# Patient Record
Sex: Female | Born: 1965 | Race: Black or African American | Hispanic: No | Marital: Married | Smoking: Never smoker
Health system: Southern US, Community
[De-identification: ages and names within clinical notes are randomized; demographics above are authoritative.]

## PROBLEM LIST (undated history)

## (undated) DIAGNOSIS — I82409 Acute embolism and thrombosis of unspecified deep veins of unspecified lower extremity: Secondary | ICD-10-CM

## (undated) DIAGNOSIS — R7303 Prediabetes: Secondary | ICD-10-CM

## (undated) DIAGNOSIS — F32A Depression, unspecified: Secondary | ICD-10-CM

## (undated) DIAGNOSIS — M1990 Unspecified osteoarthritis, unspecified site: Secondary | ICD-10-CM

## (undated) DIAGNOSIS — K219 Gastro-esophageal reflux disease without esophagitis: Secondary | ICD-10-CM

## (undated) DIAGNOSIS — D649 Anemia, unspecified: Secondary | ICD-10-CM

## (undated) DIAGNOSIS — F419 Anxiety disorder, unspecified: Secondary | ICD-10-CM

## (undated) DIAGNOSIS — I10 Essential (primary) hypertension: Secondary | ICD-10-CM

## (undated) DIAGNOSIS — R0600 Dyspnea, unspecified: Secondary | ICD-10-CM

## (undated) DIAGNOSIS — F329 Major depressive disorder, single episode, unspecified: Secondary | ICD-10-CM

## (undated) HISTORY — PX: ABDOMINAL HYSTERECTOMY: SHX81

## (undated) HISTORY — PX: WISDOM TOOTH EXTRACTION: SHX21

## (undated) HISTORY — PX: KNEE ARTHROSCOPY: SUR90

---
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
## 1998-02-15 ENCOUNTER — Emergency Department (HOSPITAL_COMMUNITY): Admission: EM | Admit: 1998-02-15 | Discharge: 1998-02-15 | Payer: Self-pay | Admitting: Emergency Medicine

## 1998-03-18 ENCOUNTER — Emergency Department (HOSPITAL_COMMUNITY): Admission: EM | Admit: 1998-03-18 | Discharge: 1998-03-18 | Payer: Self-pay | Admitting: Emergency Medicine

## 1998-03-23 ENCOUNTER — Ambulatory Visit (HOSPITAL_COMMUNITY): Admission: RE | Admit: 1998-03-23 | Discharge: 1998-03-23 | Payer: Self-pay | Admitting: Endocrinology

## 1998-04-10 ENCOUNTER — Encounter: Payer: Self-pay | Admitting: Endocrinology

## 1998-04-10 ENCOUNTER — Ambulatory Visit (HOSPITAL_COMMUNITY): Admission: RE | Admit: 1998-04-10 | Discharge: 1998-04-10 | Payer: Self-pay | Admitting: Endocrinology

## 1998-05-15 ENCOUNTER — Ambulatory Visit (HOSPITAL_BASED_OUTPATIENT_CLINIC_OR_DEPARTMENT_OTHER): Admission: RE | Admit: 1998-05-15 | Discharge: 1998-05-15 | Payer: Self-pay | Admitting: *Deleted

## 1998-10-04 ENCOUNTER — Emergency Department (HOSPITAL_COMMUNITY): Admission: EM | Admit: 1998-10-04 | Discharge: 1998-10-04 | Payer: Self-pay | Admitting: Emergency Medicine

## 2000-06-10 ENCOUNTER — Emergency Department (HOSPITAL_COMMUNITY): Admission: EM | Admit: 2000-06-10 | Discharge: 2000-06-11 | Payer: Self-pay | Admitting: Emergency Medicine

## 2000-06-11 ENCOUNTER — Ambulatory Visit (HOSPITAL_COMMUNITY): Admission: RE | Admit: 2000-06-11 | Discharge: 2000-06-11 | Payer: Self-pay | Admitting: Endocrinology

## 2001-08-20 ENCOUNTER — Encounter: Admission: RE | Admit: 2001-08-20 | Discharge: 2001-08-20 | Payer: Self-pay | Admitting: Endocrinology

## 2001-08-20 ENCOUNTER — Encounter: Payer: Self-pay | Admitting: Endocrinology

## 2010-06-18 ENCOUNTER — Emergency Department (HOSPITAL_BASED_OUTPATIENT_CLINIC_OR_DEPARTMENT_OTHER)
Admission: EM | Admit: 2010-06-18 | Discharge: 2010-06-18 | Payer: Self-pay | Source: Home / Self Care | Admitting: Emergency Medicine

## 2010-09-03 ENCOUNTER — Other Ambulatory Visit: Payer: Self-pay | Admitting: Orthopedic Surgery

## 2010-09-03 ENCOUNTER — Ambulatory Visit (HOSPITAL_COMMUNITY)
Admission: RE | Admit: 2010-09-03 | Discharge: 2010-09-03 | Disposition: A | Discharge status: Home / Self Care | Payer: Federal, State, Local not specified - PPO | Source: Ambulatory Visit | Attending: Orthopedic Surgery | Admitting: Orthopedic Surgery

## 2010-09-03 DIAGNOSIS — M79609 Pain in unspecified limb: Secondary | ICD-10-CM

## 2010-09-03 DIAGNOSIS — M7989 Other specified soft tissue disorders: Secondary | ICD-10-CM | POA: Insufficient documentation

## 2010-11-19 ENCOUNTER — Emergency Department (HOSPITAL_BASED_OUTPATIENT_CLINIC_OR_DEPARTMENT_OTHER)
Admission: EM | Admit: 2010-11-19 | Discharge: 2010-11-19 | Disposition: A | Discharge status: Home / Self Care | Payer: Federal, State, Local not specified - PPO | Attending: Emergency Medicine | Admitting: Emergency Medicine

## 2010-11-19 DIAGNOSIS — I10 Essential (primary) hypertension: Secondary | ICD-10-CM | POA: Insufficient documentation

## 2010-11-19 DIAGNOSIS — M549 Dorsalgia, unspecified: Secondary | ICD-10-CM | POA: Insufficient documentation

## 2010-11-19 DIAGNOSIS — K219 Gastro-esophageal reflux disease without esophagitis: Secondary | ICD-10-CM | POA: Insufficient documentation

## 2010-11-19 LAB — URINALYSIS, ROUTINE W REFLEX MICROSCOPIC
Bilirubin Urine: NEGATIVE
Glucose, UA: NEGATIVE mg/dL
Hgb urine dipstick: NEGATIVE
Ketones, ur: 15 mg/dL — AB
Leukocytes, UA: NEGATIVE
Nitrite: NEGATIVE
Protein, ur: NEGATIVE mg/dL
Specific Gravity, Urine: 1.025 (ref 1.005–1.030)
Urobilinogen, UA: 1 mg/dL (ref 0.0–1.0)
pH: 6.5 (ref 5.0–8.0)

## 2010-11-19 LAB — PREGNANCY, URINE: Preg Test, Ur: NEGATIVE

## 2011-02-22 ENCOUNTER — Other Ambulatory Visit: Payer: Self-pay | Admitting: Orthopedic Surgery

## 2011-02-22 DIAGNOSIS — R52 Pain, unspecified: Secondary | ICD-10-CM

## 2011-02-26 ENCOUNTER — Ambulatory Visit
Admission: RE | Admit: 2011-02-26 | Discharge: 2011-02-26 | Disposition: A | Discharge status: Home / Self Care | Payer: Federal, State, Local not specified - PPO | Source: Ambulatory Visit | Attending: Orthopedic Surgery | Admitting: Orthopedic Surgery

## 2011-02-26 DIAGNOSIS — R52 Pain, unspecified: Secondary | ICD-10-CM

## 2013-07-22 ENCOUNTER — Emergency Department (HOSPITAL_BASED_OUTPATIENT_CLINIC_OR_DEPARTMENT_OTHER)
Admission: EM | Admit: 2013-07-22 | Discharge: 2013-07-22 | Disposition: A | Discharge status: Home / Self Care | Payer: Federal, State, Local not specified - PPO | Attending: Emergency Medicine | Admitting: Emergency Medicine

## 2013-07-22 ENCOUNTER — Encounter (HOSPITAL_BASED_OUTPATIENT_CLINIC_OR_DEPARTMENT_OTHER): Payer: Self-pay | Admitting: Emergency Medicine

## 2013-07-22 ENCOUNTER — Emergency Department (HOSPITAL_BASED_OUTPATIENT_CLINIC_OR_DEPARTMENT_OTHER): Payer: Federal, State, Local not specified - PPO

## 2013-07-22 DIAGNOSIS — J209 Acute bronchitis, unspecified: Secondary | ICD-10-CM

## 2013-07-22 DIAGNOSIS — R5383 Other fatigue: Secondary | ICD-10-CM

## 2013-07-22 DIAGNOSIS — R5381 Other malaise: Secondary | ICD-10-CM | POA: Insufficient documentation

## 2013-07-22 DIAGNOSIS — R630 Anorexia: Secondary | ICD-10-CM | POA: Insufficient documentation

## 2013-07-22 MED ORDER — HYDROCOD POLST-CHLORPHEN POLST 10-8 MG/5ML PO LQCR
5.0000 mL | Freq: Once | ORAL | Status: AC
  Administered 2013-07-22: 5 mL via ORAL
  Filled 2013-07-22: qty 5

## 2013-07-22 MED ORDER — BENZONATATE 100 MG PO CAPS: 200.0000 mg | ORAL_CAPSULE | Freq: Three times a day (TID) | ORAL | Status: DC | PRN

## 2013-07-22 MED ORDER — HYDROCOD POLST-CHLORPHEN POLST 10-8 MG/5ML PO LQCR: 5.0000 mL | Freq: Two times a day (BID) | ORAL | Status: DC | PRN

## 2013-07-22 MED ORDER — ACETAMINOPHEN 325 MG PO TABS
650.0000 mg | ORAL_TABLET | Freq: Once | ORAL | Status: AC
  Administered 2013-07-22: 650 mg via ORAL
  Filled 2013-07-22: qty 2

## 2013-07-22 NOTE — ED Provider Notes (Signed)
CSN: [id number]     Arrival date & time 07/22/13  1732 History   First MD Initiated Contact with Patient 07/22/13 1955     Chief Complaint  Patient presents with  . URI     (Consider location/radiation/quality/duration/timing/severity/associated sxs/prior Treatment) HPI Comments: Beverly Salazar is a 48 y.o. Female presenting with a 4 day history of rather sudden onset of subjective fever and myalgias, nonproductive cough with bilateral anterior chest burning pain with cough only, clear nasal drainage and headache.  She also reports generalized fatigue and lack of appetite, but has been trying to maintain fluid intake.  She denies sore throat, ear or face pain, neck pain or stiffness and also denies shortness of breath,  nausea or abdominal pain.  She has taken an otc cough and cold remedy and motrin with transient relief of symptoms.     The history is provided by the patient.    History reviewed. No pertinent past medical history. Past Surgical History  Procedure Laterality Date  . Abdominal hysterectomy     No family history on file. History  Substance Use Topics  . Smoking status: Never Smoker   . Smokeless tobacco: Not on file  . Alcohol Use: No   OB History   Grav Para Term Preterm Abortions TAB SAB Ect Mult Living                 Review of Systems  Constitutional: Positive for fever and fatigue. Negative for chills.  HENT: Positive for rhinorrhea. Negative for congestion, ear pain, sinus pressure, sore throat, trouble swallowing and voice change.   Eyes: Negative for discharge.  Respiratory: Positive for cough. Negative for chest tightness, shortness of breath, wheezing and stridor.   Cardiovascular: Negative for chest pain.  Gastrointestinal: Negative for nausea, vomiting, abdominal pain and diarrhea.  Genitourinary: Negative.       Allergies  Review of patient's allergies indicates no known allergies.  Home Medications  No current outpatient  prescriptions on file. BP 134/90  Pulse 97  Temp(Src) 99.1 F (37.3 C) (Oral)  Resp 20  Ht 5\' 8"  (1.727 m)  Wt 250 lb (113.399 kg)  BMI 38.02 kg/m2  SpO2 98% Physical Exam  Nursing note and vitals reviewed. Constitutional: She appears well-developed and well-nourished.  HENT:  Head: Normocephalic and atraumatic.  Right Ear: External ear normal.  Left Ear: External ear normal.  Mouth/Throat: Oropharynx is clear and moist. No oropharyngeal exudate.  Eyes: Conjunctivae are normal.  Neck: Normal range of motion. Neck supple.  Cardiovascular: Normal rate, regular rhythm, normal heart sounds and intact distal pulses.   Pulmonary/Chest: Effort normal and breath sounds normal. She has no wheezes. She has no rales. She exhibits no tenderness.  Abdominal: Soft. Bowel sounds are normal. There is no tenderness.  Musculoskeletal: Normal range of motion.  Lymphadenopathy:    She has no cervical adenopathy.  Neurological: She is alert.  Skin: Skin is warm and dry.  Psychiatric: She has a normal mood and affect.    ED Course  Procedures (including critical care time) Labs Review Labs Reviewed - No data to display Imaging Review Dg Chest 2 View  07/22/2013   CLINICAL DATA:  Cough and congestion  EXAM: CHEST  2 VIEW  COMPARISON:  05/03/2012  FINDINGS: The heart size and mediastinal contours are within normal limits. Both lungs are clear. The visualized skeletal structures are unremarkable.  IMPRESSION: No active cardiopulmonary disease.   Electronically Signed   By: Eulah PontMark  Lukens M.D.  On: 07/22/2013 20:42    EKG Interpretation   None       MDM   Final diagnoses:  Bronchitis, acute    Patients labs and/or radiological studies were viewed and considered during the medical decision making and disposition process. Sx c/w acute bronchitis, but sx also flu like per history.  Discussed pros and cons of tamiflu,  Doubt helpfulness given pt's sx have been present for 4 days.  Pt defers  this medicine.  Will prescribe tessalon and tussionex for cough and chest pain.  Encouraged rest, increased fluid intake, tylenol or motrin for fever reduction, f/u with pcp or return here for any worsened sx.  Pt understands and agrees with plan.    Burgess Amor, PA-C 07/23/13 2103

## 2013-07-22 NOTE — ED Notes (Signed)
Patient transported to X-ray 

## 2013-07-22 NOTE — ED Notes (Signed)
Cough, headache, chills, fever, x 4 days.

## 2013-07-22 NOTE — Discharge Instructions (Signed)
Acute Bronchitis Bronchitis is inflammation of the airways that extend from the windpipe into the lungs (bronchi). The inflammation often causes mucus to develop. This leads to a cough, which is the most common symptom of bronchitis.  In acute bronchitis, the condition usually develops suddenly and goes away over time, usually in a couple weeks. Smoking, allergies, and asthma can make bronchitis worse. Repeated episodes of bronchitis may cause further lung problems.  CAUSES Acute bronchitis is most often caused by the same virus that causes a cold. The virus can spread from person to person (contagious).  SIGNS AND SYMPTOMS   Cough.   Fever.   Coughing up mucus.   Body aches.   Chest congestion.   Chills.   Shortness of breath.   Sore throat.  DIAGNOSIS  Acute bronchitis is usually diagnosed through a physical exam. Tests, such as chest X-rays, are sometimes done to rule out other conditions.  TREATMENT  Acute bronchitis usually goes away in a couple weeks. Often times, no medical treatment is necessary. Medicines are sometimes given for relief of fever or cough. Antibiotics are usually not needed but may be prescribed in certain situations. In some cases, an inhaler may be recommended to help reduce shortness of breath and control the cough. A cool mist vaporizer may also be used to help thin bronchial secretions and make it easier to clear the chest.  HOME CARE INSTRUCTIONS  Get plenty of rest.   Drink enough fluids to keep your urine clear or pale yellow (unless you have a medical condition that requires fluid restriction). Increasing fluids may help thin your secretions and will prevent dehydration.   Only take over-the-counter or prescription medicines as directed by your health care provider.   Avoid smoking and secondhand smoke. Exposure to cigarette smoke or irritating chemicals will make bronchitis worse. If you are a smoker, consider using nicotine gum or skin  patches to help control withdrawal symptoms. Quitting smoking will help your lungs heal faster.   Reduce the chances of another bout of acute bronchitis by washing your hands frequently, avoiding people with cold symptoms, and trying not to touch your hands to your mouth, nose, or eyes.   Follow up with your health care provider as directed.  SEEK MEDICAL CARE IF: Your symptoms do not improve after 1 week of treatment.  SEEK IMMEDIATE MEDICAL CARE IF:  You develop an increased fever or chills.   You have chest pain.   You have severe shortness of breath.  You have bloody sputum.   You develop dehydration.  You develop fainting.  You develop repeated vomiting.  You develop a severe headache. MAKE SURE YOU:   Understand these instructions.  Will watch your condition.  Will get help right away if you are not doing well or get worse. Document Released: 07/04/2004 Document Revised: 01/27/2013 Document Reviewed: 11/17/2012 [facility] Patient Information 2014 [employer], [place].   Rest and make sure you're drinking plenty of fluids.  You may use the tussionex syrup for cough relief, this will make you drowsy, do not drive within 4 hours of taking this medication.  Your x-rays normal tonight with no sign of lung infection such as pneumonia.  Get rechecked for any worsened symptoms.

## 2013-07-24 NOTE — ED Provider Notes (Signed)
Medical screening examination/treatment/procedure(s) were performed by non-physician practitioner and as supervising physician I was immediately available for consultation/collaboration.  EKG Interpretation   None        Glynn OctaveStephen Anzley Dibbern, MD 07/24/13 1040

## 2016-05-27 ENCOUNTER — Emergency Department (HOSPITAL_BASED_OUTPATIENT_CLINIC_OR_DEPARTMENT_OTHER)
Admission: EM | Admit: 2016-05-27 | Discharge: 2016-05-28 | Disposition: A | Discharge status: Home / Self Care | Payer: Federal, State, Local not specified - PPO | Attending: Emergency Medicine | Admitting: Emergency Medicine

## 2016-05-27 ENCOUNTER — Encounter (HOSPITAL_BASED_OUTPATIENT_CLINIC_OR_DEPARTMENT_OTHER): Payer: Self-pay | Admitting: *Deleted

## 2016-05-27 DIAGNOSIS — G8929 Other chronic pain: Secondary | ICD-10-CM | POA: Diagnosis not present

## 2016-05-27 DIAGNOSIS — M25562 Pain in left knee: Secondary | ICD-10-CM | POA: Insufficient documentation

## 2016-05-27 DIAGNOSIS — R300 Dysuria: Secondary | ICD-10-CM | POA: Diagnosis present

## 2016-05-27 DIAGNOSIS — I10 Essential (primary) hypertension: Secondary | ICD-10-CM | POA: Diagnosis not present

## 2016-05-27 DIAGNOSIS — Z79899 Other long term (current) drug therapy: Secondary | ICD-10-CM | POA: Insufficient documentation

## 2016-05-27 DIAGNOSIS — N3000 Acute cystitis without hematuria: Secondary | ICD-10-CM | POA: Diagnosis not present

## 2016-05-27 HISTORY — DX: Essential (primary) hypertension: I10

## 2016-05-27 LAB — URINALYSIS, ROUTINE W REFLEX MICROSCOPIC
Bilirubin Urine: NEGATIVE
Glucose, UA: NEGATIVE mg/dL
Hgb urine dipstick: NEGATIVE
KETONES UR: NEGATIVE mg/dL
NITRITE: NEGATIVE
PROTEIN: NEGATIVE mg/dL
Specific Gravity, Urine: 1.024 (ref 1.005–1.030)
pH: 6 (ref 5.0–8.0)

## 2016-05-27 LAB — URINALYSIS, MICROSCOPIC (REFLEX)

## 2016-05-27 MED ORDER — TRAMADOL HCL 50 MG PO TABS
50.0000 mg | ORAL_TABLET | Freq: Once | ORAL | Status: AC
  Administered 2016-05-28: 50 mg via ORAL
  Filled 2016-05-27: qty 1

## 2016-05-27 MED ORDER — ACETAMINOPHEN 500 MG PO TABS
1000.0000 mg | ORAL_TABLET | Freq: Once | ORAL | Status: AC
  Administered 2016-05-28: 1000 mg via ORAL
  Filled 2016-05-27: qty 2

## 2016-05-27 NOTE — ED Provider Notes (Signed)
[facility] Provider Note   CSN: [id number] Arrival date & time: 05/27/16  2126   By signing my name below, I, Vista Minkobert Ross, attest that this documentation has been prepared under the direction and in the presence of Tomasita CrumbleAdeleke Naida Escalante, MD. Electronically signed, Vista Minkobert Ross, ED Scribe. 05/27/16. 11:58 PM.   History   Chief Complaint Chief Complaint  Patient presents with  . Leg Pain    HPI HPI Comments: Beverly Salazar is a 50 y.o. female, with hx of htn, who presents to the Emergency Department complaining of pain and swelling to bilateral lower extremities that started two weeks ago but gradually worsened today. She states that she heard a pop today in her left knee and has exacerbating pain during ambulation and movement. She has taken 800mg  Motrin with no relief of pain. Pt states that she needs a left knee replacement. Dr. August Saucerean is her orthopedic doctor. Pt also complains of decreased urine output recently as well as decreased urgency. Pt has had UTI's in the past but states it has been a long time. She does not remember what symptoms she had with past UTI's. She has no Hx of kidney problems. No abdominal pain. No dysuria.  The history is provided by the patient. No language interpreter was used.   Past Medical History:  Diagnosis Date  . Hypertension     There are no active problems to display for this patient.   Past Surgical History:  Procedure Laterality Date  . ABDOMINAL HYSTERECTOMY      OB History    No data available      Home Medications    Prior to Admission medications   Medication Sig Start Date End Date Taking? Authorizing Provider  HYDROCHLOROTHIAZIDE PO Take by mouth.   Yes Historical Provider, MD  IBUPROFEN PO Take by mouth.   Yes Historical Provider, MD  benzonatate (TESSALON) 100 MG capsule Take 2 capsules (200 mg total) by mouth 3 (three) times daily as needed for cough. 07/22/13   Burgess AmorJulie Idol, PA-C  chlorpheniramine-HYDROcodone (TUSSIONEX  PENNKINETIC ER) 10-8 MG/5ML LQCR Take 5 mLs by mouth every 12 (twelve) hours as needed for cough. 07/22/13   Burgess AmorJulie Idol, PA-C    Family History No family history on file.  Social History Social History  Substance Use Topics  . Smoking status: Never Smoker  . Smokeless tobacco: Never Used  . Alcohol use No    Allergies   Patient has no known allergies.   Review of Systems Review of Systems A complete 10 system review of systems was obtained and all systems are negative except as noted in the HPI and PMH.    Physical Exam Updated Vital Signs BP 115/70 (BP Location: Right Arm)   Pulse 62   Temp 98.5 F (36.9 C) (Oral)   Resp 20   Ht 5\' 8"  (1.727 m)   Wt 250 lb (113.4 kg)   SpO2 99%   BMI 38.01 kg/m   Physical Exam  Constitutional: She is oriented to person, place, and time. She appears well-developed and well-nourished. No distress.  HENT:  Head: Normocephalic and atraumatic.  Nose: Nose normal.  Mouth/Throat: Oropharynx is clear and moist. No oropharyngeal exudate.  Eyes: Conjunctivae and EOM are normal. Pupils are equal, round, and reactive to light. No scleral icterus.  Neck: Normal range of motion. Neck supple. No JVD present. No tracheal deviation present. No thyromegaly present.  Cardiovascular: Normal rate, regular rhythm and normal heart sounds.  Exam reveals no gallop  and no friction rub.   No murmur heard. Pulmonary/Chest: Effort normal and breath sounds normal. No respiratory distress. She has no wheezes. She exhibits no tenderness.  Abdominal: Soft. Bowel sounds are normal. She exhibits no distension and no mass. There is no tenderness. There is no rebound and no guarding.  Musculoskeletal: She exhibits tenderness. She exhibits no edema or deformity.  Left knee. No swelling. No deformity. Pain with passive ROM. Good joint stability.   Lymphadenopathy:    She has no cervical adenopathy.  Neurological: She is alert and oriented to person, place, and time. No  cranial nerve deficit. She exhibits normal muscle tone.  Skin: Skin is warm and dry. No rash noted. No erythema. No pallor.  Nursing note and vitals reviewed.   ED Treatments / Results  DIAGNOSTIC STUDIES: Oxygen Saturation is 99% on RA, normal by my interpretation.  COORDINATION OF CARE: 11:53 PM-Discussed treatment plan with pt at bedside and pt agreed to plan.   Labs (all labs ordered are listed, but only abnormal results are displayed) Labs Reviewed  URINALYSIS, ROUTINE W REFLEX MICROSCOPIC - Abnormal; Notable for the following:       Result Value   APPearance CLOUDY (*)    Leukocytes, UA SMALL (*)    All other components within normal limits  URINALYSIS, MICROSCOPIC (REFLEX) - Abnormal; Notable for the following:    Bacteria, UA MANY (*)    Squamous Epithelial / LPF 6-30 (*)    All other components within normal limits    EKG  EKG Interpretation None       Radiology No results found.  Procedures Procedures (including critical care time)  Medications Ordered in ED Medications - No data to display   Initial Impression / Assessment and Plan / ED Course  I have reviewed the triage vital signs and the nursing notes.  Pertinent labs & imaging results that were available during my care of the patient were reviewed by me and considered in my medical decision making (see chart for details).  Clinical Course    Patient presents to the ED for worsening knee pain.  It is acute on chronic and she has an ortho doc she can follow up with.  She was given tramdol for breakthrough pain.     UA reveals bacteria.  Patient has had UTIs in the past and does not know if they typically present with dec frequency.  Will treat in this setting with keflex.  PCP and ortho fu advised.  She appears well and in NAD. Cr level is normal.  VS remain within her normal limits and she is safe for DC    Final Clinical Impressions(s) / ED Diagnoses   Final diagnoses:  None    New  Prescriptions New Prescriptions   No medications on file    I personally performed the services described in this documentation, which was scribed in my presence. The recorded information has been reviewed and is accurate.       Tomasita CrumbleAdeleke Myrtie Leuthold, MD 05/28/16 [phone]

## 2016-05-27 NOTE — ED Triage Notes (Signed)
Pain and swelling in both legs x 2 weeks. Decreased urine output.

## 2016-05-28 LAB — CBC WITH DIFFERENTIAL/PLATELET
BASOS ABS: 0 10*3/uL (ref 0.0–0.1)
BASOS PCT: 0 %
EOS ABS: 0.1 10*3/uL (ref 0.0–0.7)
EOS PCT: 1 %
HCT: 34 % — ABNORMAL LOW (ref 36.0–46.0)
HEMOGLOBIN: 11.2 g/dL — AB (ref 12.0–15.0)
Lymphocytes Relative: 41 %
Lymphs Abs: 2.9 10*3/uL (ref 0.7–4.0)
MCH: 29.3 pg (ref 26.0–34.0)
MCHC: 32.9 g/dL (ref 30.0–36.0)
MCV: 89 fL (ref 78.0–100.0)
Monocytes Absolute: 0.7 10*3/uL (ref 0.1–1.0)
Monocytes Relative: 9 %
NEUTROS PCT: 49 %
Neutro Abs: 3.5 10*3/uL (ref 1.7–7.7)
PLATELETS: 307 10*3/uL (ref 150–400)
RBC: 3.82 MIL/uL — AB (ref 3.87–5.11)
RDW: 13.7 % (ref 11.5–15.5)
WBC: 7.2 10*3/uL (ref 4.0–10.5)

## 2016-05-28 LAB — BASIC METABOLIC PANEL
ANION GAP: 7 (ref 5–15)
BUN: 15 mg/dL (ref 6–20)
CO2: 26 mmol/L (ref 22–32)
Calcium: 8.6 mg/dL — ABNORMAL LOW (ref 8.9–10.3)
Chloride: 103 mmol/L (ref 101–111)
Creatinine, Ser: 0.83 mg/dL (ref 0.44–1.00)
Glucose, Bld: 103 mg/dL — ABNORMAL HIGH (ref 65–99)
POTASSIUM: 3.1 mmol/L — AB (ref 3.5–5.1)
SODIUM: 136 mmol/L (ref 135–145)

## 2016-05-28 MED ORDER — CEPHALEXIN 500 MG PO CAPS: 500.0000 mg | ORAL_CAPSULE | Freq: Two times a day (BID) | ORAL | 0 refills | Status: DC

## 2016-05-28 MED ORDER — TRAMADOL HCL 50 MG PO TABS: 50.0000 mg | ORAL_TABLET | Freq: Two times a day (BID) | ORAL | 0 refills | Status: DC | PRN

## 2016-05-29 ENCOUNTER — Ambulatory Visit (INDEPENDENT_AMBULATORY_CARE_PROVIDER_SITE_OTHER): Admitting: Orthopedic Surgery

## 2016-05-29 ENCOUNTER — Encounter (INDEPENDENT_AMBULATORY_CARE_PROVIDER_SITE_OTHER): Payer: Self-pay | Admitting: Orthopedic Surgery

## 2016-05-29 ENCOUNTER — Ambulatory Visit (INDEPENDENT_AMBULATORY_CARE_PROVIDER_SITE_OTHER): Payer: Self-pay

## 2016-05-29 DIAGNOSIS — M1711 Unilateral primary osteoarthritis, right knee: Secondary | ICD-10-CM | POA: Diagnosis not present

## 2016-05-29 DIAGNOSIS — G8929 Other chronic pain: Secondary | ICD-10-CM

## 2016-05-29 DIAGNOSIS — M25561 Pain in right knee: Secondary | ICD-10-CM | POA: Diagnosis not present

## 2016-05-29 DIAGNOSIS — M2392 Unspecified internal derangement of left knee: Secondary | ICD-10-CM | POA: Diagnosis not present

## 2016-05-29 NOTE — Progress Notes (Signed)
Office Visit Note   Patient: Beverly Salazar           Date of Birth: [birth year]           MRN: [id number] Visit Date: 05/29/2016 Requested by: No referring provider defined for this encounter. PCP: Zoila ShutterWOODYEAR,WYNNE E, MD  Subjective: Chief Complaint  Patient presents with  . Left Knee - Pain    HPILori is a 50 year old female with left knee pain.  She injured the left knee 05/27/2016 while at work.  She was bending down to put something in the safe and then when she strained her knee she had a pop and has been really unable to weight-bear since that time.  This was a witnessed event by one of her coworkers. Been taking tramadol for pain.  No radiographs. She has no prior surgery to the left knee.  It is very difficult for her to straighten the left leg.              Review of Systems All systems reviewed are negative as they relate to the chief complaint within the history of present illness.  Patient denies  fevers or chills.    Assessment & Plan: Visit Diagnoses:  1. Primary osteoarthritis of right knee   2. Chronic pain of right knee   3. Locked knee, left     Plan: Impression is left locked knee with effusion and anteromedial joint line tenderness.  In all likelihood this represents a bucket-handle tear of the meniscus.  She's having difficulty weightbearing and has difficulty achieving full extension.  This does go along with an anterior flipped fragment.  No loose bodies seen that are calcified on the lateral radiograph. Plan is for MRI scan and out of work for 3 weeks.  Tramadol prescription provided.  Also wrote a prescription for Norco in case the pain is more severe.  I'll see her back after the MRI scan Follow-Up Instructions: No Follow-up on file.   Orders:  Orders Placed This Encounter  Procedures  . XR KNEE 3 VIEW LEFT  . MR Knee Left w/o contrast   No orders of the defined types were placed in this encounter.     Procedures: No procedures  performed   Clinical Data: No additional findings.  Objective: Vital Signs: There were no vitals taken for this visit.  Physical Exam   Constitutional: Patient appears well-developed HEENT:  Head: Normocephalic Eyes:EOM are normal Neck: Normal range of motion Cardiovascular: Normal rate Pulmonary/chest: Effort normal Neurologic: Patient is alert Skin: Skin is warm Psychiatric: Patient has normal mood and affect    Ortho ExamExamination of the left knee demonstrates an effusion much tenderness with attempts at straightening the leg.  She lacks about 10 of full extension.  She can flex it to 90 but then it becomes painful.  Collaterals and cruciates feel stable.  She does have medial and lateral joint line tenderness.  McMurray compression testing positive for medial and lateral compartment pathology pedal pulses palpable.  No other masses lymph adenopathy or skin changes noted in the left knee region  Specialty Comments:  No specialty comments available.  Imaging: Xr Knee 3 View Left  Result Date: 05/29/2016 3 views left knee were reviewed.  Mild joint space narrowing is noted on the medial compartment.  Lateral compartment is maintained.  Mild patellofemoral degenerative changes present.  No loose bodies present in the anterior joint.  Fabella present posteriorly    PMFS History: There are no active problems to  display for this patient.  Past Medical History:  Diagnosis Date  . Hypertension     No family history on file.  Past Surgical History:  Procedure Laterality Date  . ABDOMINAL HYSTERECTOMY     Social History   Occupational History  . Not on file.   Social History Main Topics  . Smoking status: Never Smoker  . Smokeless tobacco: Never Used  . Alcohol use No  . Drug use: No  . Sexual activity: Not on file

## 2016-05-30 ENCOUNTER — Ambulatory Visit (INDEPENDENT_AMBULATORY_CARE_PROVIDER_SITE_OTHER): Payer: Self-pay | Admitting: Orthopedic Surgery

## 2016-06-08 ENCOUNTER — Inpatient Hospital Stay: Admission: RE | Admit: 2016-06-08 | Payer: Federal, State, Local not specified - PPO | Source: Ambulatory Visit

## 2016-06-17 ENCOUNTER — Ambulatory Visit (INDEPENDENT_AMBULATORY_CARE_PROVIDER_SITE_OTHER): Payer: Federal, State, Local not specified - PPO | Admitting: Physician Assistant

## 2016-06-19 ENCOUNTER — Telehealth (INDEPENDENT_AMBULATORY_CARE_PROVIDER_SITE_OTHER): Payer: Self-pay | Admitting: Orthopedic Surgery

## 2016-06-19 NOTE — Telephone Encounter (Signed)
Patient called advised she has not got an approval through [facility] for [facility] to have [facility]  because [facility] is saying they have not received any medical information on her. Patient said  [facility] a form Ca20 for Dr August Saucerean to fill out and she will bring it in. The number to contact her is [phone]

## 2016-06-20 ENCOUNTER — Telehealth (INDEPENDENT_AMBULATORY_CARE_PROVIDER_SITE_OTHER): Payer: Self-pay | Admitting: Orthopedic Surgery

## 2016-06-20 NOTE — Telephone Encounter (Signed)
I talked to patient and she says that [facility] told her they could not accept her claim yet, since we listed OA of the knee, and pain of the knee.  The office note listed OA RIGHT knee and pain RIGHT knee, and then dx was LOCKED LEFT KNEE.  I wonder if they are looking at this all wrong?  The note details the injury and there is a dx of locked knee.  Can you reach out to them please?

## 2016-06-20 NOTE — Telephone Encounter (Signed)
IC NA LMVM.  See other msg

## 2016-06-20 NOTE — Telephone Encounter (Signed)
Pt called back regarding previous message and asked if you could please give her a call asap as it is important The number to contact her is [phone]

## 2016-06-20 NOTE — Telephone Encounter (Signed)
IC patient back as requested from today's message, NA, LMVM to call me.  Per referral in chart no auth yet.

## 2016-06-21 ENCOUNTER — Ambulatory Visit (INDEPENDENT_AMBULATORY_CARE_PROVIDER_SITE_OTHER): Payer: Federal, State, Local not specified - PPO | Admitting: Orthopedic Surgery

## 2016-06-25 ENCOUNTER — Telehealth (INDEPENDENT_AMBULATORY_CARE_PROVIDER_SITE_OTHER): Payer: Self-pay | Admitting: *Deleted

## 2016-06-25 NOTE — Telephone Encounter (Signed)
Pt called back to return call  °

## 2016-06-25 NOTE — Telephone Encounter (Signed)
Faxed dept of labor auth form with office note to [id number] with correct claim # [id number]. Put M23.92 (locked knee) as primary dx

## 2016-06-25 NOTE — Telephone Encounter (Signed)
Can you help with this? Patient is out of work and her knee is locked.  She needs an MRI.  Patient has said that she called WC and they have no request for MRI from us.  Per patient she has left several messages for Misty StanleyLisa and has heard no response from [employer].  Can you reach out to [employer] and to [facility] and see what we can do to get this approved ASAP?

## 2016-06-25 NOTE — Telephone Encounter (Signed)
Left vm for pt to call me back to discuss work comp claim.

## 2016-06-25 NOTE — Telephone Encounter (Signed)
Pt called left message on vm stating she is needing to speak to someone about her WC information. I called pt back and she states that is has been a month now and that she hasnt heard anything about her approval for MRI thru WC. She has tried numerous times to contact [employer] and left messages.. Pt decides to contact her WC case manager and they told her they never received the faxed for MRI. I informed pt that I would contact Misty StanleyLisa to see where we stand on this and that once I find out what status is she would hear from either myself or Misty StanleyLisa. I contacted Misty StanleyLisa and she states [facility] and that it is in review and that pt will receive notification by mail whether she has been approved or denied, but would not tell Misty StanleyLisa. Misty StanleyLisa is going to call pt and explain to pt.

## 2016-06-26 ENCOUNTER — Ambulatory Visit (HOSPITAL_COMMUNITY): Admission: RE | Admit: 2016-06-26 | Payer: Federal, State, Local not specified - PPO | Source: Ambulatory Visit

## 2016-06-28 ENCOUNTER — Ambulatory Visit (HOSPITAL_COMMUNITY): Admission: RE | Admit: 2016-06-28 | Payer: Federal, State, Local not specified - PPO | Source: Ambulatory Visit

## 2016-06-28 ENCOUNTER — Ambulatory Visit (HOSPITAL_COMMUNITY): Payer: Federal, State, Local not specified - PPO

## 2016-06-28 ENCOUNTER — Ambulatory Visit (HOSPITAL_COMMUNITY)
Admission: RE | Admit: 2016-06-28 | Discharge: 2016-06-28 | Disposition: A | Discharge status: Home / Self Care | Payer: Federal, State, Local not specified - PPO | Source: Ambulatory Visit | Attending: Orthopedic Surgery | Admitting: Orthopedic Surgery

## 2016-06-28 DIAGNOSIS — M7122 Synovial cyst of popliteal space [Baker], left knee: Secondary | ICD-10-CM | POA: Insufficient documentation

## 2016-06-28 DIAGNOSIS — M948X0 Other specified disorders of cartilage, multiple sites: Secondary | ICD-10-CM | POA: Diagnosis not present

## 2016-06-28 DIAGNOSIS — M25762 Osteophyte, left knee: Secondary | ICD-10-CM | POA: Diagnosis not present

## 2016-06-28 DIAGNOSIS — M25462 Effusion, left knee: Secondary | ICD-10-CM | POA: Diagnosis not present

## 2016-06-28 DIAGNOSIS — M2392 Unspecified internal derangement of left knee: Secondary | ICD-10-CM | POA: Diagnosis not present

## 2016-06-28 NOTE — Telephone Encounter (Signed)
Pt has appt shceduled for MRI, which was also filed and approved by her health insurance

## 2016-06-29 ENCOUNTER — Ambulatory Visit (HOSPITAL_COMMUNITY): Admission: RE | Admit: 2016-06-29 | Payer: Federal, State, Local not specified - PPO | Source: Ambulatory Visit

## 2016-07-05 ENCOUNTER — Encounter (INDEPENDENT_AMBULATORY_CARE_PROVIDER_SITE_OTHER): Payer: Self-pay | Admitting: Orthopedic Surgery

## 2016-07-05 ENCOUNTER — Ambulatory Visit (INDEPENDENT_AMBULATORY_CARE_PROVIDER_SITE_OTHER): Payer: Federal, State, Local not specified - PPO | Admitting: Orthopedic Surgery

## 2016-07-05 ENCOUNTER — Ambulatory Visit (INDEPENDENT_AMBULATORY_CARE_PROVIDER_SITE_OTHER): Admitting: Orthopedic Surgery

## 2016-07-05 ENCOUNTER — Encounter (INDEPENDENT_AMBULATORY_CARE_PROVIDER_SITE_OTHER): Payer: Self-pay

## 2016-07-05 DIAGNOSIS — M2392 Unspecified internal derangement of left knee: Secondary | ICD-10-CM

## 2016-07-05 NOTE — Progress Notes (Signed)
Office Visit Note   Patient: Beverly Salazar           Date of Birth: [birth year]           MRN: [id number] Visit Date: 07/05/2016 Requested by: Zoila ShutterWynne E Woodyear, MD [address] [place], [place] [address] PCP: Zoila ShutterWOODYEAR,WYNNE E, MD  Subjective: Chief Complaint  Patient presents with  . Left Knee - Pain, Follow-up    HPI Beverly Salazar is a 51 year old patient with left knee pain.  She reports pain at the inferior pole of patella.  This was a work injury from 05/27/2016.  Had a hyperflexion injury with popping at the time.  I thought that she might of had a locked knee last time I saw her.  That extension has improved.  MRI scan does show medial meniscal tear with no displaced fragment.  She does have degenerative changes present within the knee as well.  She has made some improvement and has tried to get back to work but had some giving way on a couple of occasions.  She is taking hydrocodone occasionally for pain at night.  She did do very well with right knee arthroscopy and partial meniscectomy previously.              Review of Systems All systems reviewed are negative as they relate to the chief complaint within the history of present illness.  Patient denies  fevers or chills.    Assessment & Plan: Visit Diagnoses: No diagnosis found.  Plan: Impression is left knee pain medial meniscal tear following a work injury plan I'm let her return to work but I think it's likely she will require surgical intervention with meniscectomy.  I think that will give her some relief but maybe not complete relief because of the amount of degenerative changes present.  I think her mechanism of injury and history distally match up with the small meniscal tear she has on the medial side.  She does have a small effusion today indicating continued irritation of the knee joint.  I will let her return to work and tentatively have her scheduled for arthroscopy and partial medial meniscectomy about 4  weeks.  If she makes a recovery and is symptom-free in the next few weeks and we can cancel surgery but at this point I think it's likely she will require partial meniscectomy in order to achieve a better level of function.  Risks and benefits discussed.  All questions answered.  Follow-Up Instructions: No Follow-up on file.   Orders:  No orders of the defined types were placed in this encounter.  No orders of the defined types were placed in this encounter.     Procedures: No procedures performed   Clinical Data: No additional findings.  Objective: Vital Signs: There were no vitals taken for this visit.  Physical Exam   Constitutional: Patient appears well-developed HEENT:  Head: Normocephalic Eyes:EOM are normal Neck: Normal range of motion Cardiovascular: Normal rate Pulmonary/chest: Effort normal Neurologic: Patient is alert Skin: Skin is warm Psychiatric: Patient has normal mood and affect    Ortho Exam knee examination today demonstrates symmetric extension bilaterally with intact extensor mechanism and stable collateral crucial ligaments.  Most of her pain is anterior medial with only slight posterior medial joint line tenderness on the left side.  On the lateral side there is no joint line tenderness.  Extensor mechanism is intact.  No other masses lymph adenopathy or skin changes noted in the left knee region.  Flexion is intact.  Mild effusion is present.  Specialty Comments:  No specialty comments available.  Imaging: No results found.   PMFS History: There are no active problems to display for this patient.  Past Medical History:  Diagnosis Date  . Hypertension     No family history on file.  Past Surgical History:  Procedure Laterality Date  . ABDOMINAL HYSTERECTOMY     Social History   Occupational History  . Not on file.   Social History Main Topics  . Smoking status: Never Smoker  . Smokeless tobacco: Never Used  . Alcohol use No  .  Drug use: No  . Sexual activity: Not on file

## 2016-07-08 ENCOUNTER — Telehealth (INDEPENDENT_AMBULATORY_CARE_PROVIDER_SITE_OTHER): Payer: Self-pay | Admitting: Orthopedic Surgery

## 2016-07-08 NOTE — Telephone Encounter (Signed)
Patient is requesting to speak to you about adding restrictions to her work note due to some changes with her job.  Cb#: [phone]

## 2016-07-09 ENCOUNTER — Telehealth (INDEPENDENT_AMBULATORY_CARE_PROVIDER_SITE_OTHER): Payer: Self-pay | Admitting: Orthopedic Surgery

## 2016-07-09 NOTE — Telephone Encounter (Signed)
Patient needs a note for work that states restrictions for no bending and stooping. Please give pt a call when note is ready. 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

## 2016-07-09 NOTE — Telephone Encounter (Signed)
Patient called back. 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 Was wanting to get restrictions stating she cannot bend, stoop or kneel. Ok to write? [facility]

## 2016-07-09 NOTE — Telephone Encounter (Signed)
I called and left voicemail. She is requesting work restrictions due to changes in her job. Advised she please call back to discuss.

## 2016-07-09 NOTE — Telephone Encounter (Signed)
Ok to write pls call thx

## 2016-07-09 NOTE — Telephone Encounter (Signed)
Ok for note pls clal thx 

## 2016-07-10 ENCOUNTER — Encounter (INDEPENDENT_AMBULATORY_CARE_PROVIDER_SITE_OTHER): Payer: Self-pay | Admitting: Radiology

## 2016-07-10 NOTE — Telephone Encounter (Signed)
Done per other message from pt by me today

## 2016-07-10 NOTE — Telephone Encounter (Signed)
IC patient and advised note ready

## 2016-07-16 ENCOUNTER — Telehealth (INDEPENDENT_AMBULATORY_CARE_PROVIDER_SITE_OTHER): Payer: Self-pay | Admitting: Orthopedic Surgery

## 2016-07-16 NOTE — Telephone Encounter (Signed)
Pt stated on the phone for the past 3 days both her legs have been swelling pretty bad, and the right leg is swollen worse than the left one. She is now concerned about the swelling. Still waiting on the Dept. Of Labor to approve pts surgery for left knee. Would you like pt to come in for an appt for the swelling?

## 2016-07-17 NOTE — Telephone Encounter (Signed)
Since this is both legs I would have her see her primary care doctor first for heartburn and kidney issues.  I don't think there is much orthopedically that will cause both legs to swell.pls clal htx

## 2016-07-17 NOTE — Telephone Encounter (Signed)
IC patient and advised.  

## 2016-07-24 ENCOUNTER — Other Ambulatory Visit (INDEPENDENT_AMBULATORY_CARE_PROVIDER_SITE_OTHER): Payer: Self-pay | Admitting: Orthopedic Surgery

## 2016-07-24 NOTE — Telephone Encounter (Signed)
Rx request 

## 2016-08-20 ENCOUNTER — Telehealth (INDEPENDENT_AMBULATORY_CARE_PROVIDER_SITE_OTHER): Payer: Self-pay | Admitting: Orthopedic Surgery

## 2016-08-20 NOTE — Telephone Encounter (Signed)
Patient is requesting a step up stool for after surgery on Monday to be able to get in and out of her bed.  Cb#: [phone]

## 2016-08-21 NOTE — Telephone Encounter (Signed)
Patient called back. [facility]ed [facility]. This was written and faxed to [phone]

## 2016-08-21 NOTE — Telephone Encounter (Signed)
IC pt and advised check [facility], and if she cannot find a stool there to [facility] handled [facility].  She will call me back if needed.

## 2016-08-26 DIAGNOSIS — S83242D Other tear of medial meniscus, current injury, left knee, subsequent encounter: Secondary | ICD-10-CM | POA: Diagnosis not present

## 2016-08-28 ENCOUNTER — Telehealth (INDEPENDENT_AMBULATORY_CARE_PROVIDER_SITE_OTHER): Payer: Self-pay | Admitting: Orthopedic Surgery

## 2016-08-28 NOTE — Telephone Encounter (Signed)
Lauren can you call?

## 2016-08-28 NOTE — Telephone Encounter (Signed)
PT CALLED TO STATE SHE HAS NOT HAD A BOWEL MOVEMENT SINCE SURGERY. SHE ALSO IS REQUESTING A COPY OF HER BEFORE AND AFTER OF SURGERY, SHE STATED SHE LOST THIS.  PLEASE ADVISE.  [phone]

## 2016-08-28 NOTE — Telephone Encounter (Signed)
Called talked with patient advised needed to take stool softeners to help with constipation.  She also asked about her surgery pictures. I told her that we had some pictures here that Dr August Saucerean could show her at her post op visit.

## 2016-09-02 ENCOUNTER — Encounter (INDEPENDENT_AMBULATORY_CARE_PROVIDER_SITE_OTHER): Payer: Self-pay | Admitting: Orthopedic Surgery

## 2016-09-02 ENCOUNTER — Ambulatory Visit (INDEPENDENT_AMBULATORY_CARE_PROVIDER_SITE_OTHER): Admitting: Orthopedic Surgery

## 2016-09-02 DIAGNOSIS — M25562 Pain in left knee: Secondary | ICD-10-CM

## 2016-09-02 MED ORDER — OXYCODONE HCL 5 MG PO TABS: 5.0000 mg | ORAL_TABLET | Freq: Three times a day (TID) | ORAL | 0 refills | Status: DC | PRN

## 2016-09-02 NOTE — Progress Notes (Signed)
   Post-Op Visit Note   Patient: Beverly Salazar           Date of Birth: 1965-07-08           MRN: [id number] Visit Date: 09/02/2016 PCP: Zoila ShutterWOODYEAR,WYNNE E, MD   Assessment & Plan:  Chief Complaint:  Chief Complaint  Patient presents with  . Left Knee - Routine Post Op   Visit Diagnoses:  1. Left knee pain, unspecified chronicity     Plan: [name] is a patient whose [age] is now about a week out left knee arthroscopy posterior medial meniscectomy.  Photos are reviewed.  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  On exam she is bending it to about 80.  Portal sutures removed.  No calf tenderness or effusion in either knee.  Does have a family history of DVT and pulmonary embolism as well as a personal history of a blood clot in the right leg.  This is back in the 90s.  Plan at this time is ultrasound bilateral lower chemise rule out DVT.  Continue aspirin until that DVT study is negative.  Refill oxycodone today.  Add ibuprofen.  I'll see her back in 4 weeks for clinical recheck.  She'll be out of work during that time.  She does work at the post office.  Follow-Up Instructions: No Follow-up on file.   Orders:  No orders of the defined types were placed in this encounter.  No orders of the defined types were placed in this encounter.   Imaging: No results found.  PMFS History: There are no active problems to display for this patient.  Past Medical History:  Diagnosis Date  . Hypertension     No family history on file.  Past Surgical History:  Procedure Laterality Date  . ABDOMINAL HYSTERECTOMY     Social History   Occupational History  . Not on file.   Social History Main Topics  . Smoking status: Never Smoker  . Smokeless tobacco: Never Used  . Alcohol use No  . Drug use: No  . Sexual activity: Not on file

## 2016-09-03 ENCOUNTER — Ambulatory Visit (HOSPITAL_COMMUNITY)
Admission: RE | Admit: 2016-09-03 | Discharge: 2016-09-03 | Disposition: A | Discharge status: Home / Self Care | Source: Ambulatory Visit | Attending: Orthopedic Surgery | Admitting: Orthopedic Surgery

## 2016-09-03 ENCOUNTER — Telehealth (INDEPENDENT_AMBULATORY_CARE_PROVIDER_SITE_OTHER): Payer: Self-pay | Admitting: Orthopedic Surgery

## 2016-09-03 DIAGNOSIS — M25562 Pain in left knee: Secondary | ICD-10-CM | POA: Insufficient documentation

## 2016-09-03 DIAGNOSIS — Z09 Encounter for follow-up examination after completed treatment for conditions other than malignant neoplasm: Secondary | ICD-10-CM

## 2016-09-03 NOTE — Progress Notes (Signed)
VASCULAR LAB PRELIMINARY  PRELIMINARY  PRELIMINARY  PRELIMINARY  Bilateral lower extremity venous duplex completed.    Preliminary report:  Bilateral:  No evidence of DVT, superficial thrombosis, or Baker's Cyst.   Kace Hartje, RVS 09/03/2016, 3:07 PM

## 2016-09-03 NOTE — Telephone Encounter (Signed)
Faxed yesterdays office note and PT rx to Danie BinderMichael Fryar, NCM fax#[phone]

## 2016-09-03 NOTE — Telephone Encounter (Signed)
PT CALLED ABOUT AN ULTRASOUND SHE WAS SUPPOSE TO HAVE TODAY, SHE DOESN'T KNOW WHERE TO GO. ALSO, HER SCRIPT FOR THE PT SHE GOT, SHE WANTS SOMEWHERE IN  [place] INSTEAD OF [place].  ADDITIONALLY, WOULD LIKE COPIES OF HER BEFORE AND AFTER SURGERY, WOULD LIKE THEM MAILED.  [phone]

## 2016-09-03 NOTE — Telephone Encounter (Signed)
Advised patient working on getting appt for u/s and she will be called.

## 2016-09-13 ENCOUNTER — Other Ambulatory Visit (INDEPENDENT_AMBULATORY_CARE_PROVIDER_SITE_OTHER): Payer: Self-pay | Admitting: Orthopedic Surgery

## 2016-09-13 NOTE — Telephone Encounter (Signed)
Rx request 

## 2016-09-17 ENCOUNTER — Telehealth (INDEPENDENT_AMBULATORY_CARE_PROVIDER_SITE_OTHER): Payer: Self-pay | Admitting: Orthopedic Surgery

## 2016-09-17 NOTE — Telephone Encounter (Signed)
Pt asked asked if referral was sent for her PT, she hasn't heard anything.  Requests a call back   [phone]

## 2016-09-17 NOTE — Telephone Encounter (Signed)
Emailed case mgr Danie Binder to check status

## 2016-09-17 NOTE — Telephone Encounter (Signed)
Do you know the status of this auth from work comp for therapy?

## 2016-09-23 ENCOUNTER — Telehealth (INDEPENDENT_AMBULATORY_CARE_PROVIDER_SITE_OTHER): Payer: Self-pay | Admitting: Orthopedic Surgery

## 2016-09-23 NOTE — Telephone Encounter (Signed)
Patient called asked if she can get a call back concerning therapy for her. Patient said she can not peddle the bike because the right knee is so stiff.  Patient asked if she can get a call back as soon as possible. Patient said both of her knee are pretty bad. The number to contact patient is [phone]

## 2016-09-23 NOTE — Telephone Encounter (Signed)
Left vm for Danie Binder, NCM to call me back regarding status of this auth. Per Leotis Shames pt has contacted her atty about this as well and they are working on it also.

## 2016-09-23 NOTE — Telephone Encounter (Signed)
Spoke with patient she is a month out from surgery.  Still has not gotten auth from w/c Dept of Labor for her therapy. She is coming in on Thursday to see you. She is concerned because her knee is getting really stiff.  She has been trying to do exercises at home but it is so painful. States she cannot even ride stationary bike because she cannot get her leg over to pedal. She is afraid that she is going to need surgery again. She has reached out to her case mgr and attorney as well. Our office is also going to follow up status of auth for therapy She questions what she could do for the pain and stiffness in her leg

## 2016-09-25 NOTE — Telephone Encounter (Signed)
I called pt to advise of status. She said that she did not refuse the case mgrs services at the last appt. Said she advised that she needed to s/w her atty first and once she did and got the ok he had already left.

## 2016-09-25 NOTE — Telephone Encounter (Signed)
Received vm from Beverly Salazar stating that pt refused his services at the last office visit he is not going to be able to facilitate any treatment for her. He will advise the adj that PT is needed and to be expecting our auth request so I should get that faxed into them. IC him back and had to leave vm. ? If I need to complete the request for PT auth or the PT facility needs to with their dept of labor info.? Correct cpt codes to put on form also.

## 2016-09-25 NOTE — Telephone Encounter (Signed)
I called and told her to get knee moving any way possible She needs pt

## 2016-09-25 NOTE — Telephone Encounter (Signed)
Ok. Thanks Working on getting auth from Insurance account manager for patient to attend physical

## 2016-09-26 ENCOUNTER — Encounter (INDEPENDENT_AMBULATORY_CARE_PROVIDER_SITE_OTHER): Payer: Self-pay | Admitting: Orthopedic Surgery

## 2016-09-26 ENCOUNTER — Ambulatory Visit (INDEPENDENT_AMBULATORY_CARE_PROVIDER_SITE_OTHER): Admitting: Orthopedic Surgery

## 2016-09-26 DIAGNOSIS — Z9889 Other specified postprocedural states: Secondary | ICD-10-CM

## 2016-09-26 NOTE — Progress Notes (Signed)
   Post-Op Visit Note   Patient: Beverly Salazar           Date of Birth: [birth year]           MRN: [id number] Visit Date: 09/26/2016 PCP: Zoila Shutter, MD   Assessment & Plan:  Chief Complaint:  Chief Complaint  Patient presents with  . Left Knee - Routine Post Op   Visit Diagnoses:  1. S/P arthroscopy of left knee     Plan: [name] is a 51 year old female who is about a month out from left knee arthroscopy posterior medial meniscectomy.  Arthroscopy photos reviewed and she has pretty significant chondral wear on the medial half of the medial condyle.  No course spotting tibial wear.  She did have a small medial meniscal tear.  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  She's having some issues with weakness.  Physical therapy starts tomorrow.  We ordered it a while ago but it's been difficult to achieve.  On exam she has no effusion but quad strength is lacking.  She does achieve full extension and has 90 of flexion.  Plan is for therapy to help her to learn a home exercise program that she needs to do for at least an hour a day in order to gain quad strength.  I'll see her back in 4 weeks for clinical recheck.  She's taking only oxycodone 1 a day along with ibuprofen.  Follow-Up Instructions: No Follow-up on file.   Orders:  No orders of the defined types were placed in this encounter.  No orders of the defined types were placed in this encounter.   Imaging: No results found.  PMFS History: There are no active problems to display for this patient.  Past Medical History:  Diagnosis Date  . Hypertension     No family history on file.  Past Surgical History:  Procedure Laterality Date  . ABDOMINAL HYSTERECTOMY     Social History   Occupational History  . Not on file.   Social History Main Topics  . Smoking status: Never Smoker  . Smokeless tobacco: Never Used  . Alcohol use No  . Drug use: No  . Sexual activity: Not on file

## 2016-10-03 ENCOUNTER — Ambulatory Visit (INDEPENDENT_AMBULATORY_CARE_PROVIDER_SITE_OTHER): Payer: Self-pay | Admitting: Orthopedic Surgery

## 2016-10-04 NOTE — Telephone Encounter (Signed)
Danie Binder called and left vm. He has now closed the file on pt. Nona Dell form we end in would have to have the facilitys DOL provider # so it would probably be best to just fax the referral to [facility] as she is very familiar with getting PT auth for dept of labor pts. Has to go through [employer]. Faxed the rx to [phone] attn: Elisa Tallant

## 2016-10-18 ENCOUNTER — Telehealth (INDEPENDENT_AMBULATORY_CARE_PROVIDER_SITE_OTHER): Payer: Self-pay | Admitting: Orthopedic Surgery

## 2016-10-18 NOTE — Telephone Encounter (Signed)
Beverly Salazar with [facility] called advised patient has not returned her calls. She states called patient several times. The number to contact Beverly Salazar is [phone]

## 2016-10-21 NOTE — Telephone Encounter (Signed)
Tried to call back. No answer. LMVM advising was returning call

## 2016-10-23 NOTE — Telephone Encounter (Signed)
Patient left me vm stating she is going for PT @ [facility] (?) and has been going there for the last 3 weeks.

## 2016-10-23 NOTE — Telephone Encounter (Signed)
Per message in chart from last Friday,  Elisa @ [facility] has been trying to call pt to get her scheduled and pt has not returned the call. IC pt and lvm for her to call me back to see if she has been scheduled yet.

## 2016-10-31 ENCOUNTER — Ambulatory Visit (INDEPENDENT_AMBULATORY_CARE_PROVIDER_SITE_OTHER): Admitting: Orthopedic Surgery

## 2016-10-31 ENCOUNTER — Encounter (INDEPENDENT_AMBULATORY_CARE_PROVIDER_SITE_OTHER): Payer: Self-pay | Admitting: Orthopedic Surgery

## 2016-10-31 DIAGNOSIS — S838X2D Sprain of other specified parts of left knee, subsequent encounter: Secondary | ICD-10-CM

## 2016-11-02 NOTE — Progress Notes (Signed)
   Post-Op Visit Note   Patient: Beverly Salazar           Date of Birth: Nov 15, 1965           MRN: [id number] Visit Date: 10/31/2016 PCP: Zoila ShutterWoodyear, Wynne E, MD   Assessment & Plan:  Chief Complaint:  Chief Complaint  Patient presents with  . Left Knee - Follow-up   Visit Diagnoses: No diagnosis found.  Plan: Beverly Salazar is a 51 year old patient 2 months out left knee arthroscopy.  Doing a little bit better.  Her walking is improving but she does report pain.  She's had to take pain medicine on 2 occasions this week.  This is episodic pain.  On exam she has improving range of motion no real effusion palpable pedal pulses some quad weakness persists.  Plan is continue physical therapy.  I'll think she is quite ready in terms of strength of her leg to return to work.  I'll see her back in 4 weeks for clinical recheck  Follow-Up Instructions: Return in about 4 weeks (around 11/28/2016).   Orders:  No orders of the defined types were placed in this encounter.  No orders of the defined types were placed in this encounter.   Imaging: No results found.  PMFS History: There are no active problems to display for this patient.  Past Medical History:  Diagnosis Date  . Hypertension     No family history on file.  Past Surgical History:  Procedure Laterality Date  . ABDOMINAL HYSTERECTOMY     Social History   Occupational History  . Not on file.   Social History Main Topics  . Smoking status: Never Smoker  . Smokeless tobacco: Never Used  . Alcohol use No  . Drug use: No  . Sexual activity: Not on file

## 2016-11-03 ENCOUNTER — Encounter (HOSPITAL_BASED_OUTPATIENT_CLINIC_OR_DEPARTMENT_OTHER): Payer: Self-pay | Admitting: Emergency Medicine

## 2016-11-03 ENCOUNTER — Emergency Department (HOSPITAL_BASED_OUTPATIENT_CLINIC_OR_DEPARTMENT_OTHER): Payer: Federal, State, Local not specified - PPO

## 2016-11-03 ENCOUNTER — Emergency Department (HOSPITAL_BASED_OUTPATIENT_CLINIC_OR_DEPARTMENT_OTHER)
Admission: EM | Admit: 2016-11-03 | Discharge: 2016-11-03 | Disposition: A | Discharge status: Home / Self Care | Payer: Federal, State, Local not specified - PPO | Attending: Emergency Medicine | Admitting: Emergency Medicine

## 2016-11-03 DIAGNOSIS — Z79899 Other long term (current) drug therapy: Secondary | ICD-10-CM | POA: Insufficient documentation

## 2016-11-03 DIAGNOSIS — M25461 Effusion, right knee: Secondary | ICD-10-CM | POA: Insufficient documentation

## 2016-11-03 DIAGNOSIS — I10 Essential (primary) hypertension: Secondary | ICD-10-CM | POA: Insufficient documentation

## 2016-11-03 MED ORDER — NAPROXEN 500 MG PO TABS: 500.0000 mg | ORAL_TABLET | Freq: Two times a day (BID) | ORAL | 0 refills | Status: DC

## 2016-11-03 MED ORDER — NAPROXEN 250 MG PO TABS
500.0000 mg | ORAL_TABLET | Freq: Once | ORAL | Status: AC
  Administered 2016-11-03: 500 mg via ORAL
  Filled 2016-11-03: qty 2

## 2016-11-03 NOTE — ED Notes (Signed)
Pt noticed increased pain and later swelling of her right knee. No known injury. Also noticed increased swelling of hands.

## 2016-11-03 NOTE — ED Triage Notes (Signed)
Patient reports about 4 days ago she started to have pain to her right knee - the patient reports swelling 3 days ago

## 2016-11-03 NOTE — ED Provider Notes (Signed)
[facility] Provider Note   CSN: [id number] Arrival date & time: 11/03/16  1610  By signing my name below, I, Beverly Salazar, attest that this documentation has been prepared under the direction and in the presence of Gwyneth SproutPlunkett, Shanik Brookshire, MD . Electronically Signed: Teofilo PodMatthew P. Salazar, ED Scribe. 11/03/2016. 5:05 PM.    History   Chief Complaint Chief Complaint  Patient presents with  . Knee Pain    The history is provided by the patient. No language interpreter was used.   HPI Comments:  Beverly Salazar is a 51 y.o. female who presents to the Emergency Department complaining of constant right knee pain x 4 days. Pt complains of associated swelling to the knee that began 3 days ago. She states that the pain is exacerbated with bearing weight. Pt reports a hx of a meniscus repair on the left knee. Pt has taken oxycodone with no relief. Pt denies other associated symptoms.    Past Medical History:  Diagnosis Date  . Hypertension     There are no active problems to display for this patient.   Past Surgical History:  Procedure Laterality Date  . ABDOMINAL HYSTERECTOMY      OB History    No data available       Home Medications    Prior to Admission medications   Medication Sig Start Date End Date Taking? Authorizing Provider  hydrochlorothiazide (HYDRODIURIL) 25 MG tablet Take 25 mg by mouth daily.   Yes [provider]  potassium chloride (KLOR-CON) 20 MEQ packet Take 40 mEq by mouth 2 (two) times daily.   Yes [provider]  cephALEXin (KEFLEX) 500 MG capsule Take 1 capsule (500 mg total) by mouth 2 (two) times daily. 05/28/16   Tomasita Crumbleni, Adeleke, MD  chlorpheniramine-HYDROcodone (TUSSIONEX PENNKINETIC ER) 10-8 MG/5ML LQCR Take 5 mLs by mouth every 12 (twelve) hours as needed for cough. 07/22/13   Burgess AmorIdol, Julie, PA-C  [facility] ASPIRIN 325 MG tablet TAKE 1 TABLET BY MOUTH EVERY DAY FOR 3 WEEKS 09/13/16   Cammy Copaean, Scott Gregory, MD  ibuprofen  (ADVIL,MOTRIN) 800 MG tablet TAKE 1 TABLET BY MOUTH DAILY WITH FOOD FOR 2 WEEKS. THEN 1 DAILY AS NEEDED FOR PAIN THEREAFTER 07/24/16   Cammy Copaean, Scott Gregory, MD  IBUPROFEN PO Take by mouth.    [provider]  oxyCODONE (OXY IR/ROXICODONE) 5 MG immediate release tablet Take 1 tablet (5 mg total) by mouth every 8 (eight) hours as needed for severe pain. 09/02/16   Cammy Copaean, Scott Gregory, MD  traMADol (ULTRAM) 50 MG tablet Take 1 tablet (50 mg total) by mouth every 12 (twelve) hours as needed for severe pain. 05/28/16   Tomasita Crumbleni, Adeleke, MD    Family History History reviewed. No pertinent family history.  Social History Social History  Substance Use Topics  . Smoking status: Never Smoker  . Smokeless tobacco: Never Used  . Alcohol use No     Allergies   Patient has no known allergies.   Review of Systems Review of Systems All systems reviewed and are negative for acute change except as noted in the HPI.   Physical Exam Updated Vital Signs BP 129/69   Pulse 66   Temp 98.7 F (37.1 C) (Oral)   Resp 18   Ht 5\' 8"  (1.727 m)   Wt 230 lb (104.3 kg)   SpO2 100%   BMI 34.97 kg/m   Physical Exam  Constitutional: She appears well-developed and well-nourished. No distress.  HENT:  Head: Normocephalic  and atraumatic.  Eyes: Conjunctivae are normal.  Cardiovascular: Normal rate.   Pulmonary/Chest: Effort normal.  Abdominal: She exhibits no distension.  Musculoskeletal:  Right knee effusion and tenderness over medial and lateral joint lines. Main with flexion > 60 degrees. No pain in popliteal fossa, no warmth or erythema.   Neurological: She is alert.  Skin: Skin is warm and dry.  Psychiatric: She has a normal mood and affect.  Nursing note and vitals reviewed.    ED Treatments / Results  DIAGNOSTIC STUDIES:  Oxygen Saturation is 100% on RA, normal by my interpretation.    COORDINATION OF CARE:  4:58 PM Discussed treatment plan with pt at bedside and pt agreed to  plan.   Labs (all labs ordered are listed, but only abnormal results are displayed) Labs Reviewed - No data to display  EKG  EKG Interpretation None       Radiology Dg Knee Complete 4 Views Right  Result Date: 11/03/2016 CLINICAL DATA:  Pt with right knee pain and swelling x 4 days, pain when bearing weight, NKI, hx meniscal tear repair in past EXAM: RIGHT KNEE - COMPLETE 4+ VIEW COMPARISON:  Plain film of the bilateral knees dated 05/29/2016. FINDINGS: Osseous alignment is normal. Bone mineralization is normal. No fracture line or displaced fracture fragment seen. No focal osseous lesion. There is mild tricompartmental degenerative spurring. No evidence of advanced degenerative joint disease. No appreciable joint effusion and adjacent soft tissues are unremarkable. IMPRESSION: 1. No acute findings. 2. Mild tricompartmental DJD. Electronically Signed   By: Bary Richard M.D.   On: 11/03/2016 16:32    Procedures Procedures (including critical care time)  Medications Ordered in ED Medications - No data to display   Initial Impression / Assessment and Plan / ED Course  I have reviewed the triage vital signs and the nursing notes.  Pertinent labs & imaging results that were available during my care of the patient were reviewed by me and considered in my medical decision making (see chart for details).     Patient presenting with worsening pain in the right knee. She denies any trauma. Patient does have appearance of right knee effusion and pain with range of motion. Patient has no evidence of a septic joint. X-ray shows mild tricompartmental J DJ. Most likely inflammation from arthritis. Patient encouraged to use anti-inflammatories ice and elevate. She sees Dr. August Saucer and encouraged to follow-up if symptoms do not improve in 1 week.  Final Clinical Impressions(s) / ED Diagnoses   Final diagnoses:  Effusion of right knee    New Prescriptions New Prescriptions   NAPROXEN  (NAPROSYN) 500 MG TABLET    Take 1 tablet (500 mg total) by mouth 2 (two) times daily.   I personally performed the services described in this documentation, which was scribed in my presence.  The recorded information has been reviewed and considered.     Gwyneth Sprout, MD 11/03/16 [phone]

## 2016-11-03 NOTE — ED Notes (Signed)
Patient transported to X-ray 

## 2016-11-03 NOTE — ED Notes (Signed)
ED Provider at bedside. 

## 2016-12-04 ENCOUNTER — Encounter (INDEPENDENT_AMBULATORY_CARE_PROVIDER_SITE_OTHER): Payer: Self-pay | Admitting: Orthopedic Surgery

## 2016-12-04 ENCOUNTER — Telehealth (INDEPENDENT_AMBULATORY_CARE_PROVIDER_SITE_OTHER): Payer: Self-pay

## 2016-12-04 ENCOUNTER — Telehealth (INDEPENDENT_AMBULATORY_CARE_PROVIDER_SITE_OTHER): Payer: Self-pay | Admitting: Orthopedic Surgery

## 2016-12-04 ENCOUNTER — Ambulatory Visit (INDEPENDENT_AMBULATORY_CARE_PROVIDER_SITE_OTHER): Admitting: Orthopedic Surgery

## 2016-12-04 DIAGNOSIS — M2392 Unspecified internal derangement of left knee: Secondary | ICD-10-CM

## 2016-12-04 DIAGNOSIS — G8929 Other chronic pain: Secondary | ICD-10-CM

## 2016-12-04 DIAGNOSIS — M25561 Pain in right knee: Secondary | ICD-10-CM

## 2016-12-04 DIAGNOSIS — Z9889 Other specified postprocedural states: Secondary | ICD-10-CM

## 2016-12-04 NOTE — Telephone Encounter (Signed)
Can provide once dictated

## 2016-12-04 NOTE — Telephone Encounter (Signed)
Patient would like to know about continuing PT.  Stated that she has an appointment on tomorrow.  Please advise.  CB# is [phone].  Thank You.

## 2016-12-04 NOTE — Telephone Encounter (Signed)
Please advise. Thanks.  

## 2016-12-04 NOTE — Telephone Encounter (Signed)
Her knee seemed pretty sore today I think I would hold off on physical therapy for now

## 2016-12-04 NOTE — Telephone Encounter (Signed)
IC patient advised.  

## 2016-12-04 NOTE — Telephone Encounter (Signed)
Pt is requesting copy of doctor notes for today's OV for WC/ to approve surgery. Wants call when ready to be picked up

## 2016-12-05 MED ORDER — LIDOCAINE HCL 1 % IJ SOLN
5.0000 mL | INTRAMUSCULAR | Status: AC | PRN
  Administered 2016-12-05: 5 mL

## 2016-12-05 MED ORDER — BUPIVACAINE HCL 0.25 % IJ SOLN
4.0000 mL | INTRAMUSCULAR | Status: AC | PRN
  Administered 2016-12-05: 4 mL via INTRA_ARTICULAR

## 2016-12-05 NOTE — Telephone Encounter (Signed)
IC LM advised could pick up at front desk in the morning.

## 2016-12-05 NOTE — Progress Notes (Signed)
Office Visit Note   Patient: Beverly Salazar           Date of Birth: 1965/08/06           MRN: [id number] Visit Date: 12/04/2016 Requested by: Zoila ShutterWoodyear, Wynne E, MD [address] [place], [place] [address] PCP: Zoila ShutterWoodyear, Wynne E, MD  Subjective: Chief Complaint  Patient presents with  . Left Knee - Follow-up    HPI: Beverly FiscalLori is a 51 year old patient who underwent left knee arthroscopy in March.  She states left knee is doing better she's a relating with crutches.  However, she was at physical therapy last week is having a lot of pain in the right knee.  She was sent for a scan on the right knee which is reviewed today.  That showed a displaced flap tear of the lateral meniscus as well as end-stage lateral compartment and tricompartmental arthritis.  She has had previous right knee surgery with meniscal debridement in 2012.  Patient does not have diabetes.  Her husband works as a Naval architecttruck driver.  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  She does have some support here in town.  She believes this may be a result of over compensation from her left knee surgery.  Patient has no family or personal history of DVT or pulmonary embolism.  She states that the right knee pain is incapacitating.  She reports primarily pain and less mechanical symptoms.              ROS: All systems reviewed are negative as they relate to the chief complaint within the history of present illness.  Patient denies  fevers or chills.   Assessment & Plan: Visit Diagnoses:  1. S/P left knee arthroscopy     Plan: Impression is left knee is doing reasonably well following left knee arthroscopy in March.  Right knee has significant pain and arthritis primarily in the lateral compartment.  I think based on the bone-on-bone nature of the arthritis in her knee that arthroscopy and meniscal debridement will be unpredictable at best in terms of any pain relief.  Her options at that point are essentially to live with what she has her to  consider knee replacement.  Knee replacement at her age is a significant consideration.  Likelihood of revision in her lifetime is high.  I do want to aspirate the right knee today and inject Toradol.  She's going to likely want to get surgery on the right knee in the near future.  Risks and benefits of total knee replacement are discussed including not limited to infection or vessel damage knee stiffness incomplete pain relief as well as potential of revision once or twice in her lifetime.  Nonetheless I think based on her current predicament that would be the most predictable way to relieve her pain.  I would likely try to use press-fit prosthesis bone sparing PCL sparing.  Follow-Up Instructions: No Follow-up on file.   Orders:  No orders of the defined types were placed in this encounter.  No orders of the defined types were placed in this encounter.     Procedures: Large Joint Inj Date/Time: 12/05/2016 11:41 AM Performed by: Cammy CopaEAN, SCOTT GREGORY Authorized by: Cammy CopaEAN, SCOTT GREGORY   Consent Given by:  Patient Site marked: the procedure site was marked   Timeout: prior to procedure the correct patient, procedure, and site was verified   Indications:  Pain, joint swelling and diagnostic evaluation Location:  Knee Site:  R knee Prep: patient was  prepped and draped in usual sterile fashion   Needle Size:  18 G Needle Length:  1.5 inches Approach:  Superolateral Ultrasound Guidance: No   Fluoroscopic Guidance: No   Arthrogram: No   Medications:  5 mL lidocaine 1 %; 4 mL bupivacaine 0.25 % Aspiration Attempted: Yes   Aspirate amount (mL):  25 Aspirate:  Yellow Patient tolerance:  Patient tolerated the procedure well with no immediate complications  Toradol injected     Clinical Data: No additional findings.  Objective: Vital Signs: There were no vitals taken for this visit.  Physical Exam:   Constitutional: Patient appears well-developed HEENT:  Head:  Normocephalic Eyes:EOM are normal Neck: Normal range of motion Cardiovascular: Normal rate Pulmonary/chest: Effort normal Neurologic: Patient is alert Skin: Skin is warm Psychiatric: Patient has normal mood and affect    Ortho Exam: Orthopedic exam demonstrates antalgic gait to the right with mild right knee effusion.  She has difficulty flexing the right knee past 90.  Left knee has improved range of motion and improving strength.  Collateral and cruciate ligaments are stable in the right knee.  Mild valgus alignment is noted.  Pedal pulses palpable.  No calf tenderness on either side.  Specialty Comments:  No specialty comments available.  Imaging: No results found.   PMFS History: There are no active problems to display for this patient.  Past Medical History:  Diagnosis Date  . Hypertension     No family history on file.  Past Surgical History:  Procedure Laterality Date  . ABDOMINAL HYSTERECTOMY     Social History   Occupational History  . Not on file.   Social History Main Topics  . Smoking status: Never Smoker  . Smokeless tobacco: Never Used  . Alcohol use No  . Drug use: No  . Sexual activity: Not on file

## 2016-12-30 ENCOUNTER — Telehealth (INDEPENDENT_AMBULATORY_CARE_PROVIDER_SITE_OTHER): Payer: Self-pay | Admitting: *Deleted

## 2016-12-30 NOTE — Telephone Encounter (Signed)
IC s/w patient and she states that she is having sharp, shooting radicular leg pain down her left leg. She does have some occasional low back pain. She also reports numbness in her left foot. She said it gets worse with increased/prolonged walking. Also occurs at times when she is laying down. States it is getting worse. Does have an upcoming appt. We have been treating her for her knees. She is a w/c case. Please advise.

## 2016-12-30 NOTE — Telephone Encounter (Signed)
She probably needs MRI scan and workup for her back at this time that we can figure that out when we see her

## 2016-12-30 NOTE — Telephone Encounter (Signed)
This is a Dr. Dean pt.  

## 2016-12-30 NOTE — Telephone Encounter (Signed)
IC. No answer. Left detailed VM advising per Dr August Saucerean.

## 2016-12-30 NOTE — Telephone Encounter (Signed)
Patient called stating she is having pain and tingling in her left leg and she has an upcoming appointment but she would like to speak with the nurse regarding this feeling she is having in her leg. Please advise

## 2017-01-01 ENCOUNTER — Ambulatory Visit (INDEPENDENT_AMBULATORY_CARE_PROVIDER_SITE_OTHER)

## 2017-01-01 ENCOUNTER — Ambulatory Visit (INDEPENDENT_AMBULATORY_CARE_PROVIDER_SITE_OTHER): Admitting: Orthopedic Surgery

## 2017-01-01 DIAGNOSIS — G8929 Other chronic pain: Secondary | ICD-10-CM

## 2017-01-01 DIAGNOSIS — M2392 Unspecified internal derangement of left knee: Secondary | ICD-10-CM

## 2017-01-01 DIAGNOSIS — M25562 Pain in left knee: Secondary | ICD-10-CM | POA: Diagnosis not present

## 2017-01-01 DIAGNOSIS — M5416 Radiculopathy, lumbar region: Secondary | ICD-10-CM

## 2017-01-01 DIAGNOSIS — M25561 Pain in right knee: Secondary | ICD-10-CM | POA: Diagnosis not present

## 2017-01-01 MED ORDER — METHYLPREDNISOLONE 4 MG PO TABS: ORAL_TABLET | ORAL | 0 refills | Status: DC

## 2017-01-01 MED ORDER — BACLOFEN 10 MG PO TABS: 10.0000 mg | ORAL_TABLET | Freq: Two times a day (BID) | ORAL | 0 refills | Status: DC

## 2017-01-03 ENCOUNTER — Encounter (INDEPENDENT_AMBULATORY_CARE_PROVIDER_SITE_OTHER): Payer: Self-pay | Admitting: Orthopedic Surgery

## 2017-01-03 NOTE — Progress Notes (Signed)
Office Visit Note   Patient: Beverly Salazar           Date of Birth: 03-29-1966           MRN: [id number] Visit Date: 01/01/2017 Requested by: Zoila ShutterWoodyear, Wynne E, MD [address] [place], [place] [address] PCP: Zoila ShutterWoodyear, Wynne E, MD  Subjective: Chief Complaint  Patient presents with  . Right Knee - Follow-up  . Left Knee - Follow-up  . Lower Back - Pain    HPI: Salazar FiscalLori is a patient with right knee pain.  She's doing recently well from her left knee arthroscopy and debridement but she has endstage arthritis in the right knee with bone-on-bone contact in the lateral compartment as well as patellofemoral arthritis.  She also describes a 2 week history of low back pain which comes and goes with some radicular leg pain going down the left-hand side.  Regarding the knee she describes night pain as well as daily pain with ambulation.  She is currently not working.              ROS: All systems reviewed are negative as they relate to the chief complaint within the history of present illness.  Patient denies  fevers or chills.   Assessment & Plan: Visit Diagnoses:  1. Chronic pain of both knees   2. Lumbar radiculopathy     Plan: Impression is low back pain with pretty normal radiographs and no definite nerve root tension signs.  I would try her with a Medrol Dosepak and muscle relaxer.  See her back in about 6-8 weeks to recheck on the back.  In regards to the right knee at length the right knee is definitely been exacerbated by all the pressure she has put on it from her ailments with the left knee including surgery.  I think that has really exacerbated an accelerated the development of arthritis in the right knee.  I may keep her out for 4 more weeks and then let her return to sedentary duty.  Come back in 8 weeks for recheck on back and right knee.  I don't think there is any further intervention that can be done on the right knee short of knee replacements that's going to  give her predictable relief.  I don't think she is a great candidate for partial knee replacement due to her patellofemoral arthritis.  See her back in 8 weeks.  Follow-Up Instructions: Return in about 8 weeks (around 02/26/2017).   Orders:  Orders Placed This Encounter  Procedures  . XR Lumbar Spine 2-3 Views   Meds ordered this encounter  Medications  . baclofen (LIORESAL) 10 MG tablet    Sig: Take 1 tablet (10 mg total) by mouth 2 (two) times daily.    Dispense:  60 each    Refill:  0  . methylPREDNISolone (MEDROL) 4 MG tablet    Sig: 6 day medrol dose pak    Dispense:  21 tablet    Refill:  0      Procedures: No procedures performed   Clinical Data: No additional findings.  Objective: Vital Signs: There were no vitals taken for this visit.  Physical Exam:   Constitutional: Patient appears well-developed HEENT:  Head: Normocephalic Eyes:EOM are normal Neck: Normal range of motion Cardiovascular: Normal rate Pulmonary/chest: Effort normal Neurologic: Patient is alert Skin: Skin is warm Psychiatric: Patient has normal mood and affect    Ortho Exam: Orthopedic exam demonstrates no nerve retention signs with  5 out of 5 ankle dorsi flexion plantar flexion quite interesting strength.  Palpable pedal pulses.  No groin pain with internal internal rotation of the leg.  Some pain with forward lateral bending.  Right knee has no effusion but significant lateral joint line tenderness with range of motion.  Left knee no effusion and improving quad strength.  Specialty Comments:  No specialty comments available.  Imaging: No results found.   PMFS History: There are no active problems to display for this patient.  Past Medical History:  Diagnosis Date  . Hypertension     No family history on file.  Past Surgical History:  Procedure Laterality Date  . ABDOMINAL HYSTERECTOMY     Social History   Occupational History  . Not on file.   Social History Main  Topics  . Smoking status: Never Smoker  . Smokeless tobacco: Never Used  . Alcohol use No  . Drug use: No  . Sexual activity: Not on file

## 2017-01-28 ENCOUNTER — Encounter (INDEPENDENT_AMBULATORY_CARE_PROVIDER_SITE_OTHER): Payer: Self-pay

## 2017-02-05 ENCOUNTER — Telehealth (INDEPENDENT_AMBULATORY_CARE_PROVIDER_SITE_OTHER): Payer: Self-pay

## 2017-02-05 ENCOUNTER — Ambulatory Visit (INDEPENDENT_AMBULATORY_CARE_PROVIDER_SITE_OTHER): Admitting: Orthopedic Surgery

## 2017-02-05 ENCOUNTER — Encounter (INDEPENDENT_AMBULATORY_CARE_PROVIDER_SITE_OTHER): Payer: Self-pay | Admitting: Orthopedic Surgery

## 2017-02-05 DIAGNOSIS — M1711 Unilateral primary osteoarthritis, right knee: Secondary | ICD-10-CM | POA: Insufficient documentation

## 2017-02-05 DIAGNOSIS — M5416 Radiculopathy, lumbar region: Secondary | ICD-10-CM

## 2017-02-05 NOTE — Telephone Encounter (Signed)
Mailed handicap placard application to patients home address

## 2017-02-06 NOTE — Progress Notes (Signed)
Office Visit Note   Patient: Beverly Salazar           Date of Birth: 07-30-1965           MRN: [id number] Visit Date: 02/05/2017 Requested by: Zoila ShutterWoodyear, Wynne E, MD [address] [place], [place] [address] PCP: Zoila ShutterWoodyear, Wynne E, MD  Subjective: Chief Complaint  Patient presents with  . Right Knee - Follow-up  . Left Knee - Follow-up  . Lower Back - Follow-up    HPI: Beverly Salazar is a 51 year old patient with bilateral knee pain and low back pain.  She reports severe right knee pain.  She had right knee arthroscopy and partial lateral meniscectomy done 2012.  She had left knee arthroscopy this year.  Left knee is doing recently well but the right knee is giving her severe incapacitating pain as well as progressive valgus malalignment.  Patient also has pain in her back which radiates down to the left foot with tingling in her heel.  This is been going on for several months.  She ambulates with a cane.  No family history or personal history of DVT or pulmonary embolism.              ROS: All systems reviewed are negative as they relate to the chief complaint within the history of present illness.  Patient denies  fevers or chills.   Assessment & Plan: Visit Diagnoses:  1. Radiculopathy, lumbar region   2. Unilateral primary osteoarthritis, right knee     Plan: Impression is low back pain with radiculopathy of several months duration.  Plan is MRI scan of the lumbar spine.  Plain radiographs unrevealing.  [facility] posterior cruciate retaining possible press-fit.  Patient does not have any metal allergies.  I'll call her with the results of the MRI scan and we will receive with total knee replacement once approved by workman's comp.  Follow-Up Instructions: No Follow-up on file.   Orders:  Orders Placed This Encounter  Procedures  . MR Lumbar Spine w/o contrast   No orders of the defined types were placed in this  encounter.     Procedures: No procedures performed   Clinical Data: No additional findings.  Objective: Vital Signs: There were no vitals taken for this visit.  Physical Exam:   Constitutional: Patient appears well-developed HEENT:  Head: Normocephalic Eyes:EOM are normal Neck: Normal range of motion Cardiovascular: Normal rate Pulmonary/chest: Effort normal Neurologic: Patient is alert Skin: Skin is warm Psychiatric: Patient has normal mood and affect    Ortho Exam: Orthopedic exam demonstrates full active and passive range of motion of the right knee with fusion and valgus alignment.  Pedal pulses palpable.  Lateral greater than medial joint line tenderness is present.  Anterior cruciate ligament PCL intact.  No groin pain with internal/external rotation of the leg.  Does have some mildly positive nerve retention signs on the left but no weakness bilateral lower chemise to ankle dorsi flexion plantar flexion quite hamstring strength testing.  Only trace effusion in the left knee is present.  Specialty Comments:  No specialty comments available.  Imaging: No results found.   PMFS History: Patient Active Problem List   Diagnosis Date Noted  . Unilateral primary osteoarthritis, right knee 02/05/2017   Past Medical History:  Diagnosis Date  . Hypertension     No family history on file.  Past Surgical History:  Procedure Laterality Date  . ABDOMINAL  HYSTERECTOMY     Social History   Occupational History  . Not on file.   Social History Main Topics  . Smoking status: Never Smoker  . Smokeless tobacco: Never Used  . Alcohol use No  . Drug use: No  . Sexual activity: Not on file

## 2017-02-11 ENCOUNTER — Other Ambulatory Visit (INDEPENDENT_AMBULATORY_CARE_PROVIDER_SITE_OTHER): Payer: Self-pay | Admitting: Orthopedic Surgery

## 2017-02-11 NOTE — Telephone Encounter (Signed)
y

## 2017-02-11 NOTE — Telephone Encounter (Signed)
Ok to rf? 

## 2017-02-23 ENCOUNTER — Other Ambulatory Visit: Payer: Self-pay

## 2017-02-26 ENCOUNTER — Ambulatory Visit (INDEPENDENT_AMBULATORY_CARE_PROVIDER_SITE_OTHER): Payer: Self-pay | Admitting: Orthopedic Surgery

## 2017-03-05 ENCOUNTER — Encounter (INDEPENDENT_AMBULATORY_CARE_PROVIDER_SITE_OTHER): Payer: Self-pay | Admitting: Orthopedic Surgery

## 2017-03-05 NOTE — Telephone Encounter (Signed)
Probably coming from her back.  She needs MRI scan of her back to help get it figured out.  That may be scheduled already.  Please call thanks

## 2017-03-05 NOTE — Telephone Encounter (Signed)
Please advise. Patient wants to know what to do about numbness in feet. Thanks.

## 2017-03-08 ENCOUNTER — Ambulatory Visit
Admission: RE | Admit: 2017-03-08 | Discharge: 2017-03-08 | Disposition: A | Discharge status: Home / Self Care | Payer: Federal, State, Local not specified - PPO | Source: Ambulatory Visit | Attending: Orthopedic Surgery | Admitting: Orthopedic Surgery

## 2017-03-08 DIAGNOSIS — M5416 Radiculopathy, lumbar region: Secondary | ICD-10-CM

## 2017-03-12 ENCOUNTER — Ambulatory Visit (INDEPENDENT_AMBULATORY_CARE_PROVIDER_SITE_OTHER): Admitting: Orthopedic Surgery

## 2017-03-12 ENCOUNTER — Encounter (INDEPENDENT_AMBULATORY_CARE_PROVIDER_SITE_OTHER): Payer: Self-pay | Admitting: Orthopedic Surgery

## 2017-03-12 DIAGNOSIS — M5440 Lumbago with sciatica, unspecified side: Secondary | ICD-10-CM

## 2017-03-12 DIAGNOSIS — M25561 Pain in right knee: Secondary | ICD-10-CM

## 2017-03-12 DIAGNOSIS — M1711 Unilateral primary osteoarthritis, right knee: Secondary | ICD-10-CM | POA: Diagnosis not present

## 2017-03-12 NOTE — Progress Notes (Signed)
Office Visit Note   Patient: Beverly Salazar           Date of Birth: 04-23-1966           MRN: [id number] Visit Date: 03/12/2017 Requested by: Zoila Shutter, MD [address] [place], [place] [address] PCP: Zoila Shutter, MD  Subjective: Chief Complaint  Patient presents with  . Lower Back - Follow-up    HPI: Beverly Salazar is a 51 year old patient with right knee pain and low back pain.  Since I have seen her she has had an MRI scan which shows new left-sided L3-4 foraminal disc which is small.  No real right-sided symptoms.  Patient does have arthritis in the right knee which is a result of increased loading following surgery on the left knee.  She has had arthritis in the knee for a while but the condition has definitely been exacerbated by the increased stress she has put on the right knee while she has been recovering from left knee surgery.              ROS: All systems reviewed are negative as they relate to the chief complaint within the history of present illness.  Patient denies  fevers or chills.   Assessment & Plan: Visit Diagnoses:  1. Midline low back pain with sciatica, sciatica laterality unspecified, unspecified chronicity     Plan: Impression is low back pain with foraminal stenosis from a small bulging disc on the left.  No right-sided pathology is noted that is significant to be causing any of her knee symptoms on the right.  Plan would be to consider consult with Dr. Alvester Morin for lumbar spine epidural steroid injections  I think that she does need right knee replacement due to the arthritis in her knee which is been exacerbated by her left knee surgery.  I will see her back if she wants to get the appointment with Dr. Alvester Morin arranged.  Follow-Up Instructions: No Follow-up on file.   Orders:  No orders of the defined types were placed in this encounter.  No orders of the defined types were placed in this encounter.     Procedures: No procedures  performed   Clinical Data: No additional findings.  Objective: Vital Signs: There were no vitals taken for this visit.  Physical Exam:   Constitutional: Patient appears well-developed HEENT:  Head: Normocephalic Eyes:EOM are normal Neck: Normal range of motion Cardiovascular: Normal rate Pulmonary/chest: Effort normal Neurologic: Patient is alert Skin: Skin is warm Psychiatric: Patient has normal mood and affect    Ortho Exam: Orthopedic exam demonstrates no nerve retention signs bilaterally no knee effusion today but she does have a lot of pain with range of motion of the right knee.  Pedal pulses palpable ankle dorsiflexion plantar flexion is intact.  Patient is walking with a cane.  Specialty Comments:  No specialty comments available.  Imaging: No results found.   PMFS History: Patient Active Problem List   Diagnosis Date Noted  . Unilateral primary osteoarthritis, right knee 02/05/2017   Past Medical History:  Diagnosis Date  . Hypertension     No family history on file.  Past Surgical History:  Procedure Laterality Date  . ABDOMINAL HYSTERECTOMY     Social History   Occupational History  . Not on file.   Social History Main Topics  . Smoking status: Never Smoker  . Smokeless tobacco: Never Used  . Alcohol use No  . Drug use: No  .  Sexual activity: Not on file

## 2017-03-15 ENCOUNTER — Encounter (INDEPENDENT_AMBULATORY_CARE_PROVIDER_SITE_OTHER): Payer: Self-pay | Admitting: Orthopedic Surgery

## 2017-03-16 ENCOUNTER — Other Ambulatory Visit (INDEPENDENT_AMBULATORY_CARE_PROVIDER_SITE_OTHER): Payer: Self-pay | Admitting: Orthopedic Surgery

## 2017-03-17 NOTE — Telephone Encounter (Signed)
Ok to rf? 

## 2017-03-17 NOTE — Telephone Encounter (Signed)
y

## 2017-03-17 NOTE — Telephone Encounter (Addendum)
Patient sent this note as my chart message. Please advise. Thanks.   ----- Message -----  From: Beverly Salazar  Sent: 03/18/2017 10:55 AM  To: [facility]  Subject: RE: Non-Urgent Medical Question           Is there something that can be prescribed for the pain or something I can do because the pain is really bad in my left knee. Today I can barely put any pressure on it.   ----- Message -----  From: Burnard Bunting, MD  Sent: 03/17/2017 8:30 AM EDT  To: [facility]  Subject: RE: Non-Urgent Medical Question  You have in stage arthritis in the knee on the left in addition to the right    ----- Message -----  From: Beverly Salazar  Sent: 03/15/2017 10:10 AM  To: [facility]  Subject: Non-Urgent Medical Question             I woke up this morning with pain in my left knee. It has been hurting me the last 2 days but this morning it is really bad! On a scale of 1-10 with ten being worst my pain is at 7. It's not the shooting pain its on the side and behind the knee. Its slightly swollen. Why am I still having pains to this degree?

## 2017-03-18 MED ORDER — ACETAMINOPHEN-CODEINE #3 300-30 MG PO TABS: 1.0000 | ORAL_TABLET | Freq: Three times a day (TID) | ORAL | 0 refills | Status: DC | PRN

## 2017-03-18 NOTE — Telephone Encounter (Signed)
Ok for t 3 1 po q 8 # 45 pls clal thx

## 2017-03-26 ENCOUNTER — Encounter (INDEPENDENT_AMBULATORY_CARE_PROVIDER_SITE_OTHER): Payer: Self-pay | Admitting: Orthopedic Surgery

## 2017-04-14 ENCOUNTER — Other Ambulatory Visit (INDEPENDENT_AMBULATORY_CARE_PROVIDER_SITE_OTHER): Payer: Self-pay | Admitting: Orthopedic Surgery

## 2017-04-14 NOTE — Telephone Encounter (Signed)
Ok to rf? 

## 2017-04-14 NOTE — Telephone Encounter (Signed)
y

## 2017-04-25 ENCOUNTER — Telehealth (INDEPENDENT_AMBULATORY_CARE_PROVIDER_SITE_OTHER): Payer: Self-pay

## 2017-04-25 ENCOUNTER — Encounter (INDEPENDENT_AMBULATORY_CARE_PROVIDER_SITE_OTHER): Payer: Self-pay | Admitting: Orthopedic Surgery

## 2017-04-25 NOTE — Telephone Encounter (Signed)
Hard to say about that one could be coming from the knee could be coming from something something else is just hard to say without seeing the patient.  Blood clot less likely especially with the pain component.

## 2017-04-25 NOTE — Telephone Encounter (Signed)
Replied to patient in mychart message 

## 2017-04-25 NOTE — Telephone Encounter (Signed)
Please advise. Following message came from patient in my chart.    Last night I was awaken from sleep with pains on the inside of my left lower leg close to my foot. It was so painful I was hollering out and woke and scared my husband. The only way I can describe it is it felt like something on the inside stabbing me several times. It was like 6 to 7 times in a row with a few seconds in between each. Is this something I need to be concerned about?

## 2017-05-15 ENCOUNTER — Encounter (INDEPENDENT_AMBULATORY_CARE_PROVIDER_SITE_OTHER): Payer: Self-pay | Admitting: Orthopedic Surgery

## 2017-05-21 ENCOUNTER — Encounter (INDEPENDENT_AMBULATORY_CARE_PROVIDER_SITE_OTHER): Payer: Self-pay | Admitting: Orthopedic Surgery

## 2017-05-22 ENCOUNTER — Other Ambulatory Visit (INDEPENDENT_AMBULATORY_CARE_PROVIDER_SITE_OTHER): Payer: Self-pay | Admitting: Orthopedic Surgery

## 2017-05-22 NOTE — Telephone Encounter (Signed)
y

## 2017-05-22 NOTE — Telephone Encounter (Signed)
Ok to rf? 

## 2017-06-25 ENCOUNTER — Other Ambulatory Visit (INDEPENDENT_AMBULATORY_CARE_PROVIDER_SITE_OTHER): Payer: Self-pay | Admitting: Orthopedic Surgery

## 2017-06-25 NOTE — Telephone Encounter (Signed)
y

## 2017-06-25 NOTE — Telephone Encounter (Signed)
Ok to rf? 

## 2017-07-02 ENCOUNTER — Encounter (INDEPENDENT_AMBULATORY_CARE_PROVIDER_SITE_OTHER): Payer: Self-pay | Admitting: Orthopedic Surgery

## 2017-07-02 NOTE — Telephone Encounter (Signed)
  Dr August Saucerean Please see my chart message from patient. Please advise.Thanks.      I don't know how much longer I can endure this pain. Not only is the right knee VERY painful on a daily basis my left knee is now in pain again, giving way when I'm walking without notice at times and having shooting, sharp pains in it as well. I (we) am asking/requesting you to please resubmit the request for RT TOTAL KNEE REPLACEMENT. The request is based on the medical findings, your response to questions OWCP asked in letter and my lawyer requested an expansion of my claim in October. He asked for the expansion stating, "we request expansion of this claim to include what we understand is an approval for right knee replacement based upon a worsening of that injury due to aggravation of osteoarthritis. In addition, we request expansion to include osteoarthritis of her right knee that was set into motion by the original injury and is now diagnosed as a consequence from the overcompensation of her left knee arthroscopy and debridement." It is hopeful that it will be accepted this  time

## 2017-07-03 ENCOUNTER — Encounter (INDEPENDENT_AMBULATORY_CARE_PROVIDER_SITE_OTHER): Payer: Self-pay

## 2017-07-03 NOTE — Telephone Encounter (Signed)
Please send a letter to whom it may concern "I am requesting an expansion of [name] Brecheisen's claim to include an approval for right knee replacement based upon a worsening of her right knee injury due to aggravation of osteoarthritis.  In addition I request the expansion to include arthritis of her right knee that was set in motion by the original injury it is now diagnosed as a consequence from the overcompensation of her left knee arthroscopy and debridement.  I think her left knee surgery has aggravated and worsened the symptoms she is having from the right knee arthritis.  If you have any questions please do not hesitate to call thank you Dorene GrebeScott Lashae Wollenberg

## 2017-07-10 ENCOUNTER — Encounter (INDEPENDENT_AMBULATORY_CARE_PROVIDER_SITE_OTHER): Payer: Self-pay | Admitting: Orthopedic Surgery

## 2017-07-24 ENCOUNTER — Telehealth (INDEPENDENT_AMBULATORY_CARE_PROVIDER_SITE_OTHER): Payer: Self-pay

## 2017-07-24 NOTE — Telephone Encounter (Signed)
Surgery request was faxed into [facility]pt [facility] on 07/08/17. Just called to check status and automated system says it is in process.

## 2017-07-25 ENCOUNTER — Other Ambulatory Visit (INDEPENDENT_AMBULATORY_CARE_PROVIDER_SITE_OTHER): Payer: Self-pay | Admitting: Orthopedic Surgery

## 2017-07-25 NOTE — Telephone Encounter (Signed)
Rx request, pls advise.  

## 2017-07-28 NOTE — Telephone Encounter (Signed)
y

## 2017-07-29 ENCOUNTER — Encounter (INDEPENDENT_AMBULATORY_CARE_PROVIDER_SITE_OTHER): Payer: Self-pay | Admitting: Orthopedic Surgery

## 2017-09-10 ENCOUNTER — Telehealth (INDEPENDENT_AMBULATORY_CARE_PROVIDER_SITE_OTHER): Payer: Self-pay

## 2017-09-10 NOTE — Telephone Encounter (Signed)
I received a form from patient for us, requesting it to be filled out so that she could continue to have assistance getting her bills paid while she is out of work. Ok to fill this out? Is there a plan for her? How much longer do you want her to continue to be out of work?

## 2017-09-15 ENCOUNTER — Other Ambulatory Visit (INDEPENDENT_AMBULATORY_CARE_PROVIDER_SITE_OTHER): Payer: Self-pay | Admitting: Orthopedic Surgery

## 2017-09-15 NOTE — Telephone Encounter (Signed)
Ok to rf? 

## 2017-09-15 NOTE — Telephone Encounter (Signed)
y

## 2017-09-17 ENCOUNTER — Encounter (INDEPENDENT_AMBULATORY_CARE_PROVIDER_SITE_OTHER): Payer: Self-pay | Admitting: Orthopedic Surgery

## 2017-09-18 ENCOUNTER — Telehealth (INDEPENDENT_AMBULATORY_CARE_PROVIDER_SITE_OTHER): Payer: Self-pay

## 2017-09-18 NOTE — Telephone Encounter (Signed)
Please review and advise. Thanks.  

## 2017-09-18 NOTE — Telephone Encounter (Signed)
fyi-just called [facility]pt [facility] IVR system and rt knee placement surgery was denied

## 2017-10-03 NOTE — Telephone Encounter (Signed)
See note, WC denied surgery.

## 2018-03-03 ENCOUNTER — Encounter (INDEPENDENT_AMBULATORY_CARE_PROVIDER_SITE_OTHER): Payer: Self-pay | Admitting: Orthopedic Surgery

## 2018-03-05 ENCOUNTER — Ambulatory Visit (INDEPENDENT_AMBULATORY_CARE_PROVIDER_SITE_OTHER)

## 2018-03-05 ENCOUNTER — Encounter (INDEPENDENT_AMBULATORY_CARE_PROVIDER_SITE_OTHER): Payer: Self-pay | Admitting: Orthopedic Surgery

## 2018-03-05 ENCOUNTER — Ambulatory Visit (INDEPENDENT_AMBULATORY_CARE_PROVIDER_SITE_OTHER): Admitting: Orthopedic Surgery

## 2018-03-05 DIAGNOSIS — M25562 Pain in left knee: Secondary | ICD-10-CM

## 2018-03-05 DIAGNOSIS — G8929 Other chronic pain: Secondary | ICD-10-CM

## 2018-03-05 DIAGNOSIS — M25561 Pain in right knee: Secondary | ICD-10-CM

## 2018-03-05 DIAGNOSIS — M1711 Unilateral primary osteoarthritis, right knee: Secondary | ICD-10-CM | POA: Diagnosis not present

## 2018-03-05 DIAGNOSIS — M1712 Unilateral primary osteoarthritis, left knee: Secondary | ICD-10-CM

## 2018-03-05 MED ORDER — TRAMADOL HCL 50 MG PO TABS: ORAL_TABLET | ORAL | 0 refills | Status: DC

## 2018-03-05 MED ORDER — IBUPROFEN-FAMOTIDINE 800-26.6 MG PO TABS: ORAL_TABLET | ORAL | 0 refills | Status: DC

## 2018-03-05 NOTE — Progress Notes (Signed)
Office Visit Note   Patient: Beverly Salazar           Date of Birth: [birth year]           MRN: [id number] Visit Date: 03/05/2018 Requested by: Zoila Shutter, MD [address] [place], [place] [address] PCP: Zoila Shutter, MD  Subjective: Chief Complaint  Patient presents with  . Left Knee - Pain  . Right Knee - Pain    HPI: Beverly Salazar is a patient with bilateral knee pain right worse than left.  She reports increasing pain and it is bothersome for her at night.  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  Denies any mechanical symptoms.  Does have a known history of arthritis in both knees.              ROS: All systems reviewed are negative as they relate to the chief complaint within the history of present illness.  Patient denies  fevers or chills.   Assessment & Plan: Visit Diagnoses:  1. Chronic pain of both knees     Plan: Impression is bilateral knee pain right worse than left with severe end-stage arthritis in that right knee particularly in the lateral compartment.  Plan is tramadol prescription along with out of work for Friday and Saturday just to try to get that knee a little break.  Duexis also prescribed to be taken on an as-needed basis.  I think she is going to need knee replacement at some time in the future.  She is on the young side for that but really no other option unless she can manage her pain a little bit better with strengthening and weight loss.  I will see her back as needed.  Follow-Up Instructions: No follow-ups on file.   Orders:  Orders Placed This Encounter  Procedures  . XR KNEE 3 VIEW RIGHT  . XR KNEE 3 VIEW LEFT   No orders of the defined types were placed in this encounter.     Procedures: No procedures performed   Clinical Data: No additional findings.  Objective: Vital Signs: There were no vitals taken for this visit.  Physical Exam:     Constitutional: Patient appears well-developed HEENT:  Head: Normocephalic Eyes:EOM are normal Neck: Normal range of motion Cardiovascular: Normal rate Pulmonary/chest: Effort normal Neurologic: Patient is alert Skin: Skin is warm Psychiatric: Patient has normal mood and affect    Ortho Exam: Ortho exam demonstrates full active and passive range of motion of the ankles and hips.  Slight valgus alignment right lower extremity.  Trace effusion right knee no effusion left knee.  She has basically full range of motion of both knees.  Extensor mechanism is intact and nontender.  No other masses lymphadenopathy or skin changes noted in that knee region.  Specialty Comments:  No specialty comments available.  Imaging: Xr Knee 3 View Left  Result Date: 03/05/2018 AP lateral merchant left knee reviewed.  Tricompartmental degenerative changes noted not as severe as the right knee.  Alignment is more neutral.  No fracture present.  Osteophyte formation is noted medially and laterally.  Xr Knee 3 View Right  Result Date: 03/05/2018 AP lateral merchant right knee reviewed.  Tricompartmental arthritis is present worse in the lateral compartment where bone-on-bone changes are noted along with osteophyte formation.  Changes are less severe in the medial and patellofemoral compartment.  Valgus alignment is seen.  No fractures.    PMFS History: Patient Active Problem List   Diagnosis Date Noted  . Unilateral primary osteoarthritis, right knee 02/05/2017   Past Medical History:  Diagnosis Date  . Hypertension     History reviewed. No pertinent family history.  Past Surgical History:  Procedure Laterality Date  . ABDOMINAL HYSTERECTOMY     Social History   Occupational History  . Not on file  Tobacco Use  . Smoking status: Never Smoker  . Smokeless tobacco: Never Used  Substance and Sexual Activity  . Alcohol use: No  . Drug use: No  . Sexual activity: Not on file

## 2018-05-06 ENCOUNTER — Ambulatory Visit (INDEPENDENT_AMBULATORY_CARE_PROVIDER_SITE_OTHER): Admitting: Orthopedic Surgery

## 2018-05-06 ENCOUNTER — Encounter (INDEPENDENT_AMBULATORY_CARE_PROVIDER_SITE_OTHER): Payer: Self-pay | Admitting: Orthopedic Surgery

## 2018-05-06 DIAGNOSIS — M79642 Pain in left hand: Secondary | ICD-10-CM | POA: Diagnosis not present

## 2018-05-06 DIAGNOSIS — G8929 Other chronic pain: Secondary | ICD-10-CM

## 2018-05-06 DIAGNOSIS — M545 Low back pain, unspecified: Secondary | ICD-10-CM

## 2018-05-10 ENCOUNTER — Encounter (INDEPENDENT_AMBULATORY_CARE_PROVIDER_SITE_OTHER): Payer: Self-pay | Admitting: Orthopedic Surgery

## 2018-05-11 ENCOUNTER — Encounter (INDEPENDENT_AMBULATORY_CARE_PROVIDER_SITE_OTHER): Payer: Self-pay | Admitting: Orthopedic Surgery

## 2018-05-11 NOTE — Progress Notes (Signed)
Office Visit Note   Patient: Beverly Salazar           Date of Birth: 1965/09/07           MRN: [id number] Visit Date: 05/06/2018 Requested by: Zoila Shutter, MD [address] [place], [place] [address] PCP: Zoila Shutter, MD  Subjective: Chief Complaint  Patient presents with  . Left Knee - Pain  . Right Knee - Pain    HPI: Beverly Salazar is a patient with bilateral knee pain.  The right one hurts worse than the left.  She is had previous left knee surgery in 2018.  Had cortisone injections last week at [facility] medicine clinic which did not help.  She has known end-stage arthritis in both knees.  I think the right knee worsened some after the left knee surgery and subsequent recovery.  Where the right knee had to do a little bit more weightbearing.  Patient has had some falls at work as well which exacerbated his pre-existing arthritic condition in both knees.  She also is having some low back pain and paresthesias down the left leg.  She is taking tramadol for the symptoms.  The back pain is been going on for several months.  Denies any bowel and bladder symptoms or fevers.              ROS: All systems reviewed are negative as they relate to the chief complaint within the history of present illness.  Patient denies  fevers or chills.   Assessment & Plan: Visit Diagnoses:  1. Chronic low back pain, unspecified back pain laterality, unspecified whether sciatica present   2. Pain in left hand     Plan: Impression is bilateral knee pain right worse than left with end-stage arthritis present.  Patient also has some numbness and tingling in the left hand.  Could have what sounds like recurrent carpal tunnel syndrome.  It is bothering her on a daily basis as well as nightly basis waking her from sleep.  This is been going on for several months.  She needs MRI lumbar spine as well as nerve conduction left hand to rule out carpal tunnel syndrome.  MRI lumbar spine as a  preamble to epidural steroid injections.  Both knees are in bad shape.  I do not think there is too much more to be done short of surgical intervention which at her young age and BMI is not a great option either but may be her only option.  Follow-Up Instructions: Return if symptoms worsen or fail to improve.   Orders:  Orders Placed This Encounter  Procedures  . MR Lumbar Spine w/o contrast  . Ambulatory referral to Physical Medicine Rehab   No orders of the defined types were placed in this encounter.     Procedures: No procedures performed   Clinical Data: No additional findings.  Objective: Vital Signs: There were no vitals taken for this visit.  Physical Exam:   Constitutional: Patient appears well-developed HEENT:  Head: Normocephalic Eyes:EOM are normal Neck: Normal range of motion Cardiovascular: Normal rate Pulmonary/chest: Effort normal Neurologic: Patient is alert Skin: Skin is warm Psychiatric: Patient has normal mood and affect    Ortho Exam: Ortho exam demonstrates slightly antalgic gait to the right with trace effusion in both knees.  Extensor mechanism is intact and pedal pulses are palpable.  No masses lymphadenopathy or skin changes noted in bilateral knee region.  She does have some nerve  root tension signs on the left and paresthesias in the L5 distribution on the left but no muscle atrophy in the leg.  Reflexes symmetric.  There is pain with forward and lateral bending in the lumbar spine.  No groin pain with internal/external rotation of either leg.  Specialty Comments:  No specialty comments available.  Imaging: No results found.   PMFS History: Patient Active Problem List   Diagnosis Date Noted  . Unilateral primary osteoarthritis, right knee 02/05/2017   Past Medical History:  Diagnosis Date  . Hypertension     History reviewed. No pertinent family history.  Past Surgical History:  Procedure Laterality Date  . ABDOMINAL HYSTERECTOMY      Social History   Occupational History  . Not on file  Tobacco Use  . Smoking status: Never Smoker  . Smokeless tobacco: Never Used  Substance and Sexual Activity  . Alcohol use: No  . Drug use: No  . Sexual activity: Not on file

## 2018-05-12 ENCOUNTER — Encounter (INDEPENDENT_AMBULATORY_CARE_PROVIDER_SITE_OTHER): Payer: Self-pay | Admitting: Orthopedic Surgery

## 2018-05-12 NOTE — Telephone Encounter (Signed)
Note is done.  

## 2018-05-12 NOTE — Telephone Encounter (Signed)
I think she just wanted that info in the note

## 2018-05-14 ENCOUNTER — Encounter (INDEPENDENT_AMBULATORY_CARE_PROVIDER_SITE_OTHER): Payer: Self-pay | Admitting: Orthopedic Surgery

## 2018-05-15 ENCOUNTER — Telehealth (INDEPENDENT_AMBULATORY_CARE_PROVIDER_SITE_OTHER): Payer: Self-pay | Admitting: Orthopedic Surgery

## 2018-05-15 NOTE — Telephone Encounter (Signed)
Patient called asked when can she pick up the office  note that Dr August Saucerean dictated on her for DOS 05/06/18? 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 Patient asked for a call when the office note is ready to pick up. The number to contact patient is [phone]

## 2018-05-19 ENCOUNTER — Encounter (INDEPENDENT_AMBULATORY_CARE_PROVIDER_SITE_OTHER): Payer: Self-pay | Admitting: Orthopedic Surgery

## 2018-05-20 NOTE — Telephone Encounter (Signed)
Ready to be picked up. She will sign release when she picks up

## 2018-05-22 ENCOUNTER — Encounter (INDEPENDENT_AMBULATORY_CARE_PROVIDER_SITE_OTHER): Payer: Self-pay | Admitting: Orthopedic Surgery

## 2018-05-26 ENCOUNTER — Ambulatory Visit
Admission: RE | Admit: 2018-05-26 | Discharge: 2018-05-26 | Disposition: A | Discharge status: Home / Self Care | Payer: Federal, State, Local not specified - PPO | Source: Ambulatory Visit | Attending: Orthopedic Surgery | Admitting: Orthopedic Surgery

## 2018-05-26 DIAGNOSIS — M545 Low back pain, unspecified: Secondary | ICD-10-CM

## 2018-05-26 DIAGNOSIS — G8929 Other chronic pain: Secondary | ICD-10-CM

## 2018-05-27 ENCOUNTER — Ambulatory Visit (INDEPENDENT_AMBULATORY_CARE_PROVIDER_SITE_OTHER): Admitting: Physical Medicine and Rehabilitation

## 2018-05-27 ENCOUNTER — Encounter (INDEPENDENT_AMBULATORY_CARE_PROVIDER_SITE_OTHER): Payer: Self-pay | Admitting: Physical Medicine and Rehabilitation

## 2018-05-27 ENCOUNTER — Ambulatory Visit (INDEPENDENT_AMBULATORY_CARE_PROVIDER_SITE_OTHER): Payer: Self-pay | Admitting: Orthopedic Surgery

## 2018-05-27 DIAGNOSIS — R202 Paresthesia of skin: Secondary | ICD-10-CM | POA: Diagnosis not present

## 2018-05-27 NOTE — Progress Notes (Signed)
 .  Numeric Pain Rating Scale and Functional Assessment Average Pain 7   In the last MONTH (on 0-10 scale) has pain interfered with the following?  1. General activity like being  able to carry out your everyday physical activities such as walking, climbing stairs, carrying groceries, or moving a chair?  Rating(5)   

## 2018-05-28 NOTE — Progress Notes (Signed)
Beverly LatinLaurie D Palen - 52 y.o. female MRN [id number]  Date of birth: [birth year]  Office Visit Note: Visit Date: 05/27/2018 PCP: Zoila ShutterWoodyear, Wynne E, MD Referred by: Zoila ShutterWoodyear, Wynne E, MD  Subjective: Chief Complaint  Patient presents with  . Left Hand - Numbness   HPI: Beverly Salazar is a 52 y.o. female who comes in today At the request of Dr. Burnard BuntingG. Scott Dean for electrodiagnostic study of the left upper limb.  Patient is right-hand dominant and reports approximately 1 month of severe left hand tingling which is nondermatomal but is referred from the shoulder to the hand and is worse at night.  She gets numbness and tingling mostly of the palm of the left hand.  She denies any right-sided complaints.  She does note tingling throughout the day with typing.  She has not had any focal weakness.  She reports a history of prior carpal tunnel release on both hands remotely.  She reports her average pain is 7 out of 10.  ROS Otherwise per HPI.  Assessment & Plan: Visit Diagnoses:  1. 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   There is no significant electrodiagnostic evidence of any other focal nerve entrapment, brachial plexopathy or cervical radiculopathy.  As you know, this particular electrodiagnostic study cannot rule out chemical radiculitis or sensory only radiculopathy.  Recommendations: 1.  Follow-up with referring physician. 2.  Continue current management of symptoms. 3.  Continue use of resting splint at night-time and as needed during the day.    Meds & Orders: No orders of the defined types were placed in this encounter.   Orders Placed This Encounter  Procedures  . NCV with EMG (electromyography)    Follow-up: Return for Dr. Burnard BuntingG. Scott Dean.   Procedures: No procedures performed  EMG & NCV  Findings: Evaluation of the left median (across palm) sensory nerve showed prolonged distal peak latency (Wrist, 3.8 ms) and prolonged distal peak latency (Palm, 2.3 ms).  All remaining nerves (as indicated in the following tables) were within normal limits.    All examined muscles (as indicated in the following table) showed no evidence of electrical instability.    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   There is no significant electrodiagnostic evidence of any other focal nerve entrapment, brachial plexopathy or cervical radiculopathy.  As you know, this particular electrodiagnostic study cannot rule out chemical radiculitis or sensory only radiculopathy.  Recommendations: 1.  Follow-up with referring physician. 2.  Continue current management of symptoms. 3.  Continue use of resting splint at night-time and as needed during the day.  ___________________________ Elease HashimotoFred Jerrid Forgette FAAPMR Board Certified, American Board of Physical Medicine and Rehabilitation    Nerve Conduction Studies Anti Sensory Summary Table   Stim Site NR Peak (ms) Norm Peak (ms) P-T Amp (V) Norm P-T Amp Site1 Site2 Delta-P (ms) Dist (cm) Vel (m/s) Norm Vel (m/s)  Left Median Acr Palm Anti Sensory (2nd Digit)  32.4C  Wrist    *3.8 <3.6 36.0 >10 Wrist Palm 1.5 0.0    Palm    *2.3 <2.0 3.0         Left Radial Anti Sensory (Base 1st Digit)  32.8C  Wrist    2.1 <3.1  27.8  Wrist Base 1st Digit 2.1 0.0    Left Ulnar Anti Sensory (5th Digit)  32.8C  Wrist    3.2 <3.7 27.4 >15.0 Wrist 5th Digit 3.2 14.0 44 >38   Motor Summary Table   Stim Site NR Onset (ms) Norm Onset (ms) O-P Amp (mV) Norm O-P Amp Site1 Site2 Delta-0 (ms) Dist (cm) Vel (m/s) Norm Vel (m/s)  Left Median Motor (Abd Poll Brev)  33C  Wrist    3.9 <4.2 9.7 >5 Elbow Wrist 4.1 22.0 54 >50  Elbow    8.0  9.7          Left Ulnar Motor (Abd Dig Min)  33.2C  Wrist    2.8 <4.2 8.5 >3 B Elbow Wrist 3.5 22.0 63 >53  B Elbow    6.3  7.9  A Elbow B Elbow 1.4 12.0 86 >53  A Elbow    7.7  7.8          EMG   Side Muscle Nerve Root Ins Act Fibs Psw Amp Dur Poly Recrt Int Dennie BiblePat Comment  Left Abd Poll Brev Median C8-T1 Nml Nml Nml Nml Nml 0 Nml Nml   Left 1stDorInt Ulnar C8-T1 Nml Nml Nml Nml Nml 0 Nml Nml   Left PronatorTeres Median C6-7 Nml Nml Nml Nml Nml 0 Nml Nml   Left Biceps Musculocut C5-6 Nml Nml Nml Nml Nml 0 Nml Nml   Left Deltoid Axillary C5-6 Nml Nml Nml Nml Nml 0 Nml Nml     Nerve Conduction Studies Anti Sensory Left/Right Comparison   Stim Site L Lat (ms) R Lat (ms) L-R Lat (ms) L Amp (V) R Amp (V) L-R Amp (%) Site1 Site2 L Vel (m/s) R Vel (m/s) L-R Vel (m/s)  Median Acr Palm Anti Sensory (2nd Digit)  32.4C  Wrist *3.8   36.0   Wrist Palm     Palm *2.3   3.0         Radial Anti Sensory (Base 1st Digit)  32.8C  Wrist 2.1   27.8   Wrist Base 1st Digit     Ulnar Anti Sensory (5th Digit)  32.8C  Wrist 3.2   27.4   Wrist 5th Digit 44     Motor Left/Right Comparison   Stim Site L Lat (ms) R Lat (ms) L-R Lat (ms) L Amp (mV) R Amp (mV) L-R Amp (%) Site1 Site2 L Vel (m/s) R Vel (m/s) L-R Vel (m/s)  Median Motor (Abd Poll Brev)  33C  Wrist 3.9   9.7   Elbow Wrist 54    Elbow 8.0   9.7         Ulnar Motor (Abd Dig Min)  33.2C  Wrist 2.8   8.5   B Elbow Wrist 63    B Elbow 6.3   7.9   A Elbow B Elbow 86    A Elbow 7.7   7.8            Waveforms:            Clinical History: No specialty comments available.   She reports that she has never smoked. She has never used smokeless tobacco. No results for input(s): HGBA1C, LABURIC in the last 8760 hours.  Objective:  VS:  HT:    WT:   BMI:     BP:   HR: bpm  TEMP: ( )  RESP:  Physical Exam Musculoskeletal:        General: No swelling, tenderness or deformity.  Comments: Inspection reveals no atrophy of the bilateral APB  or FDI or hand intrinsics. There is no swelling, color changes, allodynia or dystrophic changes. There is 5 out of 5 strength in the bilateral wrist extension, finger abduction and long finger flexion. There is intact sensation to light touch in all dermatomal and peripheral nerve distributions. There is a negative Hoffmann's test bilaterally.  Skin:    General: Skin is warm and dry.     Findings: No erythema or rash.  Neurological:     General: No focal deficit present.     Mental Status: She is alert and oriented to person, place, and time.     Sensory: No sensory deficit.     Motor: No weakness or abnormal muscle tone.     Coordination: Coordination normal.  Psychiatric:        Mood and Affect: Mood normal.        Behavior: Behavior normal.     Ortho Exam Imaging: No results found.  Past Medical/Family/Surgical/Social History: Medications & Allergies reviewed per EMR, new medications updated. Patient Active Problem List   Diagnosis Date Noted  . Unilateral primary osteoarthritis, right knee 02/05/2017   Past Medical History:  Diagnosis Date  . Hypertension    History reviewed. No pertinent family history. Past Surgical History:  Procedure Laterality Date  . ABDOMINAL HYSTERECTOMY     Social History   Occupational History  . Not on file  Tobacco Use  . Smoking status: Never Smoker  . Smokeless tobacco: Never Used  Substance and Sexual Activity  . Alcohol use: No  . Drug use: No  . Sexual activity: Not on file

## 2018-05-28 NOTE — Procedures (Signed)
EMG & NCV Findings: Evaluation of the left median (across palm) sensory nerve showed prolonged distal peak latency (Wrist, 3.8 ms) and prolonged distal peak latency (Palm, 2.3 ms).  All remaining nerves (as indicated in the following tables) were within normal limits.    All examined muscles (as indicated in the following table) showed no evidence of electrical instability.    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   There is no significant electrodiagnostic evidence of any other focal nerve entrapment, brachial plexopathy or cervical radiculopathy.  As you know, this particular electrodiagnostic study cannot rule out chemical radiculitis or sensory only radiculopathy.  Recommendations: 1.  Follow-up with referring physician. 2.  Continue current management of symptoms. 3.  Continue use of resting splint at night-time and as needed during the day.  ___________________________ Beverly Salazar FAAPMR Board Certified, American Board of Physical Medicine and Rehabilitation    Nerve Conduction Studies Anti Sensory Summary Table   Stim Site NR Peak (ms) Norm Peak (ms) P-T Amp (V) Norm P-T Amp Site1 Site2 Delta-P (ms) Dist (cm) Vel (m/s) Norm Vel (m/s)  Left Median Acr Palm Anti Sensory (2nd Digit)  32.4C  Wrist    *3.8 <3.6 36.0 >10 Wrist Palm 1.5 0.0    Palm    *2.3 <2.0 3.0         Left Radial Anti Sensory (Base 1st Digit)  32.8C  Wrist    2.1 <3.1 27.8  Wrist Base 1st Digit 2.1 0.0    Left Ulnar Anti Sensory (5th Digit)  32.8C  Wrist    3.2 <3.7 27.4 >15.0 Wrist 5th Digit 3.2 14.0 44 >38   Motor Summary Table   Stim Site NR Onset (ms) Norm Onset (ms) O-P Amp (mV) Norm O-P Amp Site1 Site2 Delta-0 (ms) Dist (cm) Vel (m/s) Norm Vel (m/s)  Left Median Motor (Abd Poll Brev)  33C  Wrist    3.9 <4.2 9.7 >5 Elbow Wrist 4.1 22.0 54 >50  Elbow     8.0  9.7         Left Ulnar Motor (Abd Dig Min)  33.2C  Wrist    2.8 <4.2 8.5 >3 B Elbow Wrist 3.5 22.0 63 >53  B Elbow    6.3  7.9  A Elbow B Elbow 1.4 12.0 86 >53  A Elbow    7.7  7.8          EMG   Side Muscle Nerve Root Ins Act Fibs Psw Amp Dur Poly Recrt Int Dennie BiblePat Comment  Left Abd Poll Brev Median C8-T1 Nml Nml Nml Nml Nml 0 Nml Nml   Left 1stDorInt Ulnar C8-T1 Nml Nml Nml Nml Nml 0 Nml Nml   Left PronatorTeres Median C6-7 Nml Nml Nml Nml Nml 0 Nml Nml   Left Biceps Musculocut C5-6 Nml Nml Nml Nml Nml 0 Nml Nml   Left Deltoid Axillary C5-6 Nml Nml Nml Nml Nml 0 Nml Nml     Nerve Conduction Studies Anti Sensory Left/Right Comparison   Stim Site L Lat (ms) R Lat (ms) L-R Lat (ms) L Amp (V) R Amp (V) L-R Amp (%) Site1 Site2 L Vel (m/s) R Vel (m/s) L-R Vel (m/s)  Median Acr Palm Anti Sensory (2nd Digit)  32.4C  Wrist *3.8   36.0   Wrist Palm     Palm *2.3   3.0  Radial Anti Sensory (Base 1st Digit)  32.8C  Wrist 2.1   27.8   Wrist Base 1st Digit     Ulnar Anti Sensory (5th Digit)  32.8C  Wrist 3.2   27.4   Wrist 5th Digit 44     Motor Left/Right Comparison   Stim Site L Lat (ms) R Lat (ms) L-R Lat (ms) L Amp (mV) R Amp (mV) L-R Amp (%) Site1 Site2 L Vel (m/s) R Vel (m/s) L-R Vel (m/s)  Median Motor (Abd Poll Brev)  33C  Wrist 3.9   9.7   Elbow Wrist 54    Elbow 8.0   9.7         Ulnar Motor (Abd Dig Min)  33.2C  Wrist 2.8   8.5   B Elbow Wrist 63    B Elbow 6.3   7.9   A Elbow B Elbow 86    A Elbow 7.7   7.8            Waveforms:

## 2018-05-29 ENCOUNTER — Encounter (INDEPENDENT_AMBULATORY_CARE_PROVIDER_SITE_OTHER): Payer: Self-pay | Admitting: Orthopedic Surgery

## 2018-05-29 ENCOUNTER — Ambulatory Visit (INDEPENDENT_AMBULATORY_CARE_PROVIDER_SITE_OTHER): Admitting: Orthopedic Surgery

## 2018-05-29 VITALS — Ht 68.0 in | Wt 230.0 lb

## 2018-05-29 DIAGNOSIS — M25561 Pain in right knee: Secondary | ICD-10-CM

## 2018-05-29 DIAGNOSIS — G8929 Other chronic pain: Secondary | ICD-10-CM | POA: Diagnosis not present

## 2018-05-29 DIAGNOSIS — M25562 Pain in left knee: Secondary | ICD-10-CM

## 2018-05-31 ENCOUNTER — Encounter (INDEPENDENT_AMBULATORY_CARE_PROVIDER_SITE_OTHER): Payer: Self-pay | Admitting: Orthopedic Surgery

## 2018-05-31 NOTE — Progress Notes (Signed)
Office Visit Note   Patient: Beverly Salazar           Date of Birth: 04-28-1966           MRN: [id number] Visit Date: 05/29/2018 Requested by: Zoila ShutterWoodyear, Wynne E, MD [address] [place], [place] [address] PCP: Zoila ShutterWoodyear, Wynne E, MD  Subjective: Chief Complaint  Patient presents with  . Left Wrist - Pain  . Lower Back - Pain  . Right Knee - Pain  . Left Knee - Pain    HPI: Salazar FiscalLori is a 52 year old patient with bilateral knee arthritis.  She is here to follow-up MRI lumbar spine and nerve conduction study of the left upper extremity.  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  MRI lumbar spine is pretty unremarkable for any acute findings.  She also describes bilateral knee pain.  On November 19 she was at work.  She had an injury at work at that time.  She was doing some physical type of work and the right knee gave way.  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  That caused her to fall in the left knee also was injured at the same time.  I saw her for that acute exacerbation of previously asymptomatic knees and did bilateral knee injections which really did not help her symptoms.  Tramadol is also not helping with her symptoms.  She had right knee arthroscopy in 2012 and left knee arthroscopy in 2018.  She has been ambulating with a cane.  She has not really been able to work due to the exacerbation of her back symptoms and the symptoms from this fall at work on November 19.  Her right knee is the one giving her the most problem and that is the one that she would like to have replaced.  I think we have exhausted all nonoperative measures for the knee.  She is on the young side for knee replacement but that is really her best option at this time.  Body mass index also elevated for joint replacement.              ROS: All systems reviewed are negative as they  relate to the chief complaint within the history of present illness.  Patient denies  fevers or chills.   Assessment & Plan: Visit Diagnoses:  1. Pain in both knees, unspecified chronicity   2. Chronic pain of both knees     Plan: Impression is exacerbation of existing knee arthritis from injury November 19.  MRI lumbar spine pretty unremarkable and nerve conduction study shows mild carpal tunnel syndrome on the left.  Plan is for her to continue to work on strengthening and recovering from this exacerbation injury.  Out of work until 1-20 and then she is okay for sedentary work at that time.  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  I will see her back as needed  Follow-Up Instructions: Return if symptoms worsen or fail to improve.   Orders:  No orders of the defined types were placed in this encounter.  No orders of the defined types were placed in this encounter.     Procedures: No procedures  performed   Clinical Data: No additional findings.  Objective: Vital Signs: Ht 5\' 8"  (1.727 m)   Wt 230 lb (104.3 kg)   BMI 34.97 kg/m   Physical Exam:   Constitutional: Patient appears well-developed HEENT:  Head: Normocephalic Eyes:EOM are normal Neck: Normal range of motion Cardiovascular: Normal rate Pulmonary/chest: Effort normal Neurologic: Patient is alert Skin: Skin is warm Psychiatric: Patient has normal mood and affect    Ortho Exam: Ortho exam demonstrates antalgic gait to the right with no effusion in either knee.  Collateral crucial ligaments are stable bilaterally.  No nerve root tension signs today.  No groin pain with internal X rotation of the leg.  She does have some patellofemoral crepitus bilaterally as well as medial and lateral joint line tenderness bilaterally.  No abductor pollicis brevis wasting on that left-hand side.  No definite  paresthesias L1 S1 bilaterally.  Specialty Comments:  No specialty comments available.  Imaging: No results found.   PMFS History: Patient Active Problem List   Diagnosis Date Noted  . Unilateral primary osteoarthritis, right knee 02/05/2017   Past Medical History:  Diagnosis Date  . Hypertension     History reviewed. No pertinent family history.  Past Surgical History:  Procedure Laterality Date  . ABDOMINAL HYSTERECTOMY     Social History   Occupational History  . Not on file  Tobacco Use  . Smoking status: Never Smoker  . Smokeless tobacco: Never Used  Substance and Sexual Activity  . Alcohol use: No  . Drug use: No  . Sexual activity: Not on file

## 2018-06-01 ENCOUNTER — Encounter (INDEPENDENT_AMBULATORY_CARE_PROVIDER_SITE_OTHER): Payer: Self-pay | Admitting: Orthopedic Surgery

## 2018-06-01 NOTE — Telephone Encounter (Signed)
Been seen

## 2018-06-17 ENCOUNTER — Ambulatory Visit (INDEPENDENT_AMBULATORY_CARE_PROVIDER_SITE_OTHER): Admitting: Orthopedic Surgery

## 2018-06-17 ENCOUNTER — Encounter (INDEPENDENT_AMBULATORY_CARE_PROVIDER_SITE_OTHER): Payer: Self-pay | Admitting: Orthopedic Surgery

## 2018-06-17 DIAGNOSIS — M1711 Unilateral primary osteoarthritis, right knee: Secondary | ICD-10-CM

## 2018-06-17 DIAGNOSIS — M1712 Unilateral primary osteoarthritis, left knee: Secondary | ICD-10-CM | POA: Diagnosis not present

## 2018-06-17 NOTE — Progress Notes (Signed)
Office Visit Note   Patient: Beverly Salazar           Date of Birth: [birth year]           MRN: [id number] Visit Date: 06/17/2018 Requested by: Zoila ShutterWoodyear, Wynne E, MD [address] [place], [place] [address] PCP: Zoila ShutterWoodyear, Wynne E, MD  Subjective: Chief Complaint  Patient presents with  . Right Knee - Pain  . Left Knee - Pain    HPI: Beverly FiscalLori is a patient with bilateral knee pain.  She has had left knee arthroscopy couple years ago.  Has had right knee exacerbation of existing arthritis from an injury last year.  In general she is in a gradual decline in function.  She has known fairly severe arthritis in both knees.  She has failed nonoperative and conservative measures.  She is out of work currently until 06/29/2018 due to medical issue.  I had her returning to sedentary work late last year.              ROS: All systems reviewed are negative as they relate to the chief complaint within the history of present illness.  Patient denies  fevers or chills.   Assessment & Plan: Visit Diagnoses:  1. Unilateral primary osteoarthritis, left knee   2. Unilateral primary osteoarthritis, right knee     Plan: Impression is bilateral knee arthritis with history of left knee arthroscopy from Workmen's Comp. injury about 2 years ago and exacerbation of right knee arthritis from another injury on the job late last year.  I think the patient is heading for knee replacement surgery.  She is really failed all measures of conservative treatment.  I will see her back when all that gets worked out.  I did send a note to that effect to her adjuster today outlining a little bit more the history and other factors related to her knee injuries.  Follow-Up Instructions: Return if symptoms worsen or fail to improve.   Orders:  No orders of the defined types were placed in this encounter.  No orders of the defined types were placed in this encounter.     Procedures: No procedures  performed   Clinical Data: No additional findings.  Objective: Vital Signs: There were no vitals taken for this visit.  Physical Exam:   Constitutional: Patient appears well-developed HEENT:  Head: Normocephalic Eyes:EOM are normal Neck: Normal range of motion Cardiovascular: Normal rate Pulmonary/chest: Effort normal Neurologic: Patient is alert Skin: Skin is warm Psychiatric: Patient has normal mood and affect    Ortho Exam: Ortho exam demonstrates slight valgus alignment bilateral knees.  Pedal pulses palpable.  No effusion in either knee today and the range of motion is actually pretty reasonable with full extension and flexion past 90.  No groin pain with internal or external Tatian of the leg.  Specialty Comments:  No specialty comments available.  Imaging: No results found.   PMFS History: Patient Active Problem List   Diagnosis Date Noted  . Unilateral primary osteoarthritis, right knee 02/05/2017   Past Medical History:  Diagnosis Date  . Hypertension     History reviewed. No pertinent family history.  Past Surgical History:  Procedure Laterality Date  . ABDOMINAL HYSTERECTOMY     Social History   Occupational History  . Not on file  Tobacco Use  . Smoking status: Never Smoker  . Smokeless tobacco: Never Used  Substance and Sexual Activity  . Alcohol use: No  . Drug use:  No  . Sexual activity: Not on file

## 2018-07-14 ENCOUNTER — Encounter (INDEPENDENT_AMBULATORY_CARE_PROVIDER_SITE_OTHER): Payer: Self-pay | Admitting: Orthopedic Surgery

## 2018-07-14 NOTE — Telephone Encounter (Signed)
Sounds good. Thank you

## 2018-07-15 ENCOUNTER — Ambulatory Visit (INDEPENDENT_AMBULATORY_CARE_PROVIDER_SITE_OTHER): Payer: Self-pay | Admitting: Orthopedic Surgery

## 2018-08-07 ENCOUNTER — Encounter (INDEPENDENT_AMBULATORY_CARE_PROVIDER_SITE_OTHER): Payer: Self-pay | Admitting: Orthopedic Surgery

## 2018-08-12 ENCOUNTER — Ambulatory Visit (INDEPENDENT_AMBULATORY_CARE_PROVIDER_SITE_OTHER): Admitting: Orthopedic Surgery

## 2018-08-12 ENCOUNTER — Encounter (INDEPENDENT_AMBULATORY_CARE_PROVIDER_SITE_OTHER): Payer: Self-pay | Admitting: Orthopedic Surgery

## 2018-08-12 DIAGNOSIS — M1711 Unilateral primary osteoarthritis, right knee: Secondary | ICD-10-CM

## 2018-08-12 DIAGNOSIS — M1712 Unilateral primary osteoarthritis, left knee: Secondary | ICD-10-CM

## 2018-08-12 NOTE — Progress Notes (Signed)
Office Visit Note   Patient: Beverly Salazar           Date of Birth: August 21, 1965           MRN: [id number] Visit Date: 08/12/2018 Requested by: Zoila Shutter, MD [address] [place], [place] [address] PCP: Zoila Shutter, MD  Subjective: Chief Complaint  Patient presents with  . discuss surgery    HPI: Beverly Salazar is a patient with bilateral knee arthritis.  She has failed conservative treatment.  She has end-stage global arthritis in the knees worse in the lateral compartment.  She has had multiple rounds of conservative treatment including prior arthroscopy cortisone and gel injections along with therapy and recently she has been doing weight loss.  She has lost about 23 pounds since her last clinic visit.  She is using a cane.  She describes night pain and rest pain as well as pain which keeps her from doing standing and walking activities.              ROS: All systems reviewed are negative as they relate to the chief complaint within the history of present illness.  Patient denies  fevers or chills.   Assessment & Plan: Visit Diagnoses:  1. Unilateral primary osteoarthritis, left knee   2. Unilateral primary osteoarthritis, right knee     Plan: Impression is bilateral knee arthritis with valgus alignment in the right knee worse than the left knee.  Plan is right total knee replacement.  The patient is young for total knee replacement and does have increased body mass index but she has failed all conservative measures.  I think press-fit prosthesis would be ideal for her if her bone quality is sufficient.  This could give her the opportunity for high longevity prosthesis.  She will need some lateral releases on that right side in order to facilitate more normal alignment and thus improve the longevity of her knee.  The risk and benefits of surgery are discussed including not limited to infection nerve vessel damage knee stiffness and incomplete pain relief.  The  extensive nature of the rehabilitative process also discussed.  I do not think that she is a great candidate for bilateral knee replacement given that she is healthy.  My sense is that she would do better with sequential knee replacements and it is conceivable that when she recovers from the right knee that the left knee may improve enough with increased pressure on the right knee that she could delay the left total knee replacement.  Patient understands the risk and benefits and wishes to proceed.  All questions answered  Follow-Up Instructions: No follow-ups on file.   Orders:  No orders of the defined types were placed in this encounter.  No orders of the defined types were placed in this encounter.     Procedures: No procedures performed   Clinical Data: No additional findings.  Objective: Vital Signs: There were no vitals taken for this visit.  Physical Exam:   Constitutional: Patient appears well-developed HEENT:  Head: Normocephalic Eyes:EOM are normal Neck: Normal range of motion Cardiovascular: Normal rate Pulmonary/chest: Effort normal Neurologic: Patient is alert Skin: Skin is warm Psychiatric: Patient has normal mood and affect    Ortho Exam: Ortho exam demonstrates full active and passive range of motion of the hips and ankles.  No effusion in the right knee or left knee.  She has valgus alignment which is mild in the right knee.  Extensor mechanism  is intact.  Left knee has full extension right knee lacks about 5 degrees of full extension.  She has about 95 degrees of flexion in the right knee and about 115 in the left.  Pedal pulses palpable.  Ankle dorsiflexion intact.  Specialty Comments:  No specialty comments available.  Imaging: No results found.   PMFS History: Patient Active Problem List   Diagnosis Date Noted  . Unilateral primary osteoarthritis, right knee 02/05/2017   Past Medical History:  Diagnosis Date  . Hypertension     History  reviewed. No pertinent family history.  Past Surgical History:  Procedure Laterality Date  . ABDOMINAL HYSTERECTOMY     Social History   Occupational History  . Not on file  Tobacco Use  . Smoking status: Never Smoker  . Smokeless tobacco: Never Used  Substance and Sexual Activity  . Alcohol use: No  . Drug use: No  . Sexual activity: Not on file

## 2018-08-12 NOTE — Telephone Encounter (Signed)
Patient seen today thanks

## 2018-08-14 ENCOUNTER — Encounter (INDEPENDENT_AMBULATORY_CARE_PROVIDER_SITE_OTHER): Payer: Self-pay | Admitting: Orthopedic Surgery

## 2018-08-19 ENCOUNTER — Encounter (INDEPENDENT_AMBULATORY_CARE_PROVIDER_SITE_OTHER): Payer: Self-pay | Admitting: Orthopedic Surgery

## 2018-08-24 ENCOUNTER — Encounter (INDEPENDENT_AMBULATORY_CARE_PROVIDER_SITE_OTHER): Payer: Self-pay | Admitting: Orthopedic Surgery

## 2018-09-01 ENCOUNTER — Telehealth (INDEPENDENT_AMBULATORY_CARE_PROVIDER_SITE_OTHER): Payer: Self-pay

## 2018-09-01 NOTE — Telephone Encounter (Signed)
I do not have copy, sent off to scan place.

## 2018-09-01 NOTE — Telephone Encounter (Signed)
Employer is wanting to verify duty status report from pts last visit. States that it looks like it might have been altered so they are just wanting to speak with someone to verify if the one they have is correct. I didn't see where there is one in the chart yet. Do you still have a copy?

## 2018-09-01 NOTE — Telephone Encounter (Signed)
Case mgr called to let me know he was just assigned this case and to request the last office note. He faxed release. Advised him that knee replacement was ordered and I faxed request into dept of labor and it has been denied. He asked that I fax him all the info I faxed in with the auth request and he will get with the claims examiner to see if he can get some answers and keep me posted.

## 2018-09-02 NOTE — Telephone Encounter (Signed)
fyi-I called Beverly Salazar back and advised that we do not have a copy of the form and that it was sent to be scanned and is not in chart yet, Asked if he could fax me a copy of the form as well as a description on the areas that he thinks may have been altered and I can see if Dr. August Saucer can look at and sign off on whether or not the form they have is correct.

## 2018-09-04 ENCOUNTER — Encounter (INDEPENDENT_AMBULATORY_CARE_PROVIDER_SITE_OTHER): Payer: Self-pay | Admitting: Orthopedic Surgery

## 2018-09-07 ENCOUNTER — Telehealth (INDEPENDENT_AMBULATORY_CARE_PROVIDER_SITE_OTHER): Payer: Self-pay

## 2018-09-07 NOTE — Telephone Encounter (Signed)
CA-17 form from 08/12/18 was faxed by Franki Cabot (occupational health nurse with [place] [employer]). They are wondering if it was altered by the patient and just needed clarification. Copy we made of CA-17 was send to scan place and still is not in chart so I had Al [place] fax me a copy of the form so Dr. August Saucer could look at. Dr. August Saucer signed off on 09/04/18 that what was on form was correct. I called Al and advised and faxed him copy of form. [phone]

## 2018-09-16 ENCOUNTER — Encounter (INDEPENDENT_AMBULATORY_CARE_PROVIDER_SITE_OTHER): Payer: Self-pay | Admitting: Orthopedic Surgery

## 2018-09-19 ENCOUNTER — Encounter (INDEPENDENT_AMBULATORY_CARE_PROVIDER_SITE_OTHER): Payer: Self-pay | Admitting: Orthopedic Surgery

## 2018-09-21 NOTE — Telephone Encounter (Signed)
Pls tell her great ihave posted it not sure when it will get done

## 2018-11-16 ENCOUNTER — Encounter: Payer: Self-pay | Admitting: Orthopedic Surgery

## 2018-11-16 NOTE — Telephone Encounter (Signed)
I envision you staying for 2 nights and not 1 night.  That would be essentially the day of surgery plus the next day plus the next day.

## 2018-11-30 ENCOUNTER — Encounter: Payer: Self-pay | Admitting: Orthopedic Surgery

## 2018-12-01 NOTE — Telephone Encounter (Signed)
This is a Architectural technologist. thing so I am not sure how it is handled in terms of it being ordered.  If it needs to be reordered if you can preorder this that she needs that would be great thanks

## 2018-12-10 ENCOUNTER — Encounter: Payer: Self-pay | Admitting: Orthopedic Surgery

## 2018-12-10 ENCOUNTER — Telehealth: Payer: Self-pay | Admitting: Orthopedic Surgery

## 2018-12-10 NOTE — Telephone Encounter (Signed)
Patient called to check if we have received her FMLA forms. She stated she mailed forms to [place] 6/23. I told her we have not received them. These forms are for her husband to care for her after her surgery. Asked her if she could have her husbands employer email me another copy. Gave her my direct ph and email address. I will ensure forms get turned around quickly as possible.

## 2018-12-14 NOTE — Progress Notes (Signed)
[id number] - [place], [place] - [address] [place] [id number] Phone: [phone] Fax: [phone]      Your procedure is scheduled on July 14th.  Report to [facility] Main Entrance "A" at 5:30 A.M., and check in at the Admitting office.  Call this number if you have problems the morning of surgery:  [phone]  Call [phone] if you have any questions prior to your surgery date Monday-Friday 8am-4pm    Remember:  Do not eat after midnight the night before your surgery  You may drink clear liquids until 4:15 AM the morning of your surgery.   Clear liquids allowed are: Water, Non-Citrus Juices (without pulp), Carbonated Beverages, Clear Tea, Black Coffee Only, and Gatorade  Please complete your PRE-SURGERY ENSURE that was provided to you by ... the morning of surgery.  Please, if able, drink it in one setting. DO NOT SIP.     Take these medicines the morning of surgery with A SIP OF WATER   Buproprion (Wellbutrin)  Escitalopram (Lexapro)    7 days prior to surgery STOP taking any Aspirin (unless otherwise instructed by your surgeon), Aleve, Naproxen, Ibuprofen, Motrin, Advil, Goody's, BC's, all herbal medications, fish oil, and all vitamins.   WHAT DO I DO ABOUT MY DIABETES MEDICATION?   Marland Kitchen. Do not take oral diabetes medicines (pills) the morning of surgery. - Metformin   How to Manage Your Diabetes Before and After Surgery  Why is it important to control my blood sugar before and after surgery? . Improving blood sugar levels before and after surgery helps healing and can limit problems. . A way of improving blood sugar control is eating a healthy diet by: o  Eating less sugar and carbohydrates o  Increasing activity/exercise o  Talking with your doctor about reaching your blood sugar goals . High blood sugars (greater than 180 mg/dL) can raise your risk of infections and slow your recovery, so you  will need to focus on controlling your diabetes during the weeks before surgery. . Make sure that the doctor who takes care of your diabetes knows about your planned surgery including the date and location.  How do I manage my blood sugar before surgery? . Check your blood sugar at least 4 times a day, starting 2 days before surgery, to make sure that the level is not too high or low. o Check your blood sugar the morning of your surgery when you wake up and every 2 hours until you get to the Short Stay unit. . If your blood sugar is less than 70 mg/dL, you will need to treat for low blood sugar: o Do not take insulin. o Treat a low blood sugar (less than 70 mg/dL) with  cup of clear juice (cranberry or apple), 4 glucose tablets, OR glucose gel. o Recheck blood sugar in 15 minutes after treatment (to make sure it is greater than 70 mg/dL). If your blood sugar is not greater than 70 mg/dL on recheck, call [phone] for further instructions. . Report your blood sugar to the short stay nurse when you get to Short Stay.  . If you are admitted to the hospital after surgery: o Your blood sugar will be checked by the staff and you will probably be given insulin after surgery (instead of oral diabetes medicines) to make sure you have good blood sugar levels. o The goal for blood sugar control after  surgery is 80-180 mg/dL.    The Morning of Surgery  Do not wear jewelry, make-up or nail polish.  Do not wear lotions, powders, or perfumes/colognes, or deodorant  Do not shave 48 hours prior to surgery.  Men may shave face and neck.  Do not bring valuables to the hospital.  [facility] is not responsible for any belongings or valuables.  If you are a smoker, DO NOT Smoke 24 hours prior to surgery IF you wear a CPAP at night please bring your mask, tubing, and machine the morning of surgery   Remember that you must have someone to transport you home after your surgery, and remain with you for 24  hours if you are discharged the same day.   Contacts, glasses, hearing aids, dentures or bridgework may not be worn into surgery.    Leave your suitcase in the car.  After surgery it may be brought to your room.  For patients admitted to the hospital, discharge time will be determined by your treatment team.  Patients discharged the day of surgery will not be allowed to drive home.    Special instructions:   [place]- Preparing For Surgery  Before surgery, you can play an important role. Because skin is not sterile, your skin needs to be as free of germs as possible. You can reduce the number of germs on your skin by washing with CHG (chlorahexidine gluconate) Soap before surgery.  CHG is an antiseptic cleaner which kills germs and bonds with the skin to continue killing germs even after washing.    Oral Hygiene is also important to reduce your risk of infection.  Remember - BRUSH YOUR TEETH THE MORNING OF SURGERY WITH YOUR REGULAR TOOTHPASTE  Please do not use if you have an allergy to CHG or antibacterial soaps. If your skin becomes reddened/irritated stop using the CHG.  Do not shave (including legs and underarms) for at least 48 hours prior to first CHG shower. It is OK to shave your face.  Please follow these instructions carefully.   1. Shower the NIGHT BEFORE SURGERY and the MORNING OF SURGERY with CHG Soap.   2. If you chose to wash your hair, wash your hair first as usual with your normal shampoo.  3. After you shampoo, rinse your hair and body thoroughly to remove the shampoo.  4. Use CHG as you would any other liquid soap. You can apply CHG directly to the skin and wash gently with a scrungie or a clean washcloth.   5. Apply the CHG Soap to your body ONLY FROM THE NECK DOWN.  Do not use on open wounds or open sores. Avoid contact with your eyes, ears, mouth and genitals (private parts). Wash Face and genitals (private parts)  with your normal soap.   6. Wash  thoroughly, paying special attention to the area where your surgery will be performed.  7. Thoroughly rinse your body with warm water from the neck down.  8. DO NOT shower/wash with your normal soap after using and rinsing off the CHG Soap.  9. Pat yourself dry with a CLEAN TOWEL.  10. Wear CLEAN PAJAMAS to bed the night before surgery, wear comfortable clothes the morning of surgery  11. Place CLEAN SHEETS on your bed the night of your first shower and DO NOT SLEEP WITH PETS.    Day of Surgery:  Do not apply any deodorants/lotions. Please shower the morning of surgery with the CHG soap  Please wear clean clothes  to the hospital/surgery center.   Remember to brush your teeth WITH YOUR REGULAR TOOTHPASTE.   Please read over the following fact sheets that you were given.

## 2018-12-15 ENCOUNTER — Encounter (HOSPITAL_COMMUNITY)
Admission: RE | Admit: 2018-12-15 | Discharge: 2018-12-15 | Disposition: A | Discharge status: Home / Self Care | Source: Ambulatory Visit | Attending: Orthopedic Surgery | Admitting: Orthopedic Surgery

## 2018-12-15 ENCOUNTER — Other Ambulatory Visit: Payer: Self-pay

## 2018-12-15 ENCOUNTER — Encounter (HOSPITAL_COMMUNITY): Payer: Self-pay

## 2018-12-15 DIAGNOSIS — Z01818 Encounter for other preprocedural examination: Secondary | ICD-10-CM | POA: Insufficient documentation

## 2018-12-15 DIAGNOSIS — R9431 Abnormal electrocardiogram [ECG] [EKG]: Secondary | ICD-10-CM | POA: Diagnosis not present

## 2018-12-15 DIAGNOSIS — Z1159 Encounter for screening for other viral diseases: Secondary | ICD-10-CM | POA: Insufficient documentation

## 2018-12-15 DIAGNOSIS — R001 Bradycardia, unspecified: Secondary | ICD-10-CM | POA: Diagnosis not present

## 2018-12-15 HISTORY — DX: Unspecified osteoarthritis, unspecified site: M19.90

## 2018-12-15 HISTORY — DX: Depression, unspecified: F32.A

## 2018-12-15 HISTORY — DX: Prediabetes: R73.03

## 2018-12-15 HISTORY — DX: Anxiety disorder, unspecified: F41.9

## 2018-12-15 HISTORY — DX: Anemia, unspecified: D64.9

## 2018-12-15 LAB — CBC
HCT: 39.4 % (ref 36.0–46.0)
Hemoglobin: 12.9 g/dL (ref 12.0–15.0)
MCH: 29.3 pg (ref 26.0–34.0)
MCHC: 32.7 g/dL (ref 30.0–36.0)
MCV: 89.5 fL (ref 80.0–100.0)
Platelets: 353 10*3/uL (ref 150–400)
RBC: 4.4 MIL/uL (ref 3.87–5.11)
RDW: 13.6 % (ref 11.5–15.5)
WBC: 5.2 10*3/uL (ref 4.0–10.5)
nRBC: 0 % (ref 0.0–0.2)

## 2018-12-15 LAB — SURGICAL PCR SCREEN
MRSA, PCR: NEGATIVE
Staphylococcus aureus: NEGATIVE

## 2018-12-15 LAB — BASIC METABOLIC PANEL
Anion gap: 8 (ref 5–15)
BUN: 11 mg/dL (ref 6–20)
CO2: 30 mmol/L (ref 22–32)
Calcium: 9.2 mg/dL (ref 8.9–10.3)
Chloride: 101 mmol/L (ref 98–111)
Creatinine, Ser: 1.21 mg/dL — ABNORMAL HIGH (ref 0.44–1.00)
GFR calc Af Amer: >60 mL/min (ref >60)
GFR calc non Af Amer: 52 mL/min — ABNORMAL LOW (ref >60)
Glucose, Bld: 97 mg/dL (ref 70–99)
Potassium: 3.2 mmol/L — ABNORMAL LOW (ref 3.5–5.1)
Sodium: 139 mmol/L (ref 135–145)

## 2018-12-15 LAB — HEMOGLOBIN A1C
Hgb A1c MFr Bld: 5.4 % (ref 4.8–5.6)
Mean Plasma Glucose: 108.28 mg/dL

## 2018-12-15 LAB — URINALYSIS, ROUTINE W REFLEX MICROSCOPIC
Bilirubin Urine: NEGATIVE
Glucose, UA: NEGATIVE mg/dL
Hgb urine dipstick: NEGATIVE
Ketones, ur: NEGATIVE mg/dL
Leukocytes,Ua: NEGATIVE
Nitrite: NEGATIVE
Protein, ur: NEGATIVE mg/dL
Specific Gravity, Urine: 1.015 (ref 1.005–1.030)
pH: 7 (ref 5.0–8.0)

## 2018-12-15 LAB — GLUCOSE, CAPILLARY: Glucose-Capillary: 86 mg/dL (ref 70–99)

## 2018-12-15 NOTE — Progress Notes (Signed)
PCP - [facility] in [facility] on [address] Cardiologist - na  Chest x-ray - na EKG - today Stress Test - na ECHO - not sure Cardiac Cath - na  Sleep Study - na CPAP -   Fasting Blood Sugar - does not check, doesn't know of any fasting sugars Checks Blood Sugar _____ times a day  Blood Thinner Instructions: Aspirin Instructions:na  Anesthesia review:   Pt. Stopped phentermine 12/11/18  Patient denies shortness of breath, fever, cough and chest pain at PAT appointment   Patient verbalized understanding of instructions that were given to them at the PAT appointment. Patient was also instructed that they will need to review over the PAT instructions again at home before surgery.

## 2018-12-15 NOTE — Progress Notes (Signed)
Please call patient with results. Thanks she need kcl tabs 10 meq po bid for 5 days before surgery thx

## 2018-12-15 NOTE — Progress Notes (Signed)
[id number] - [place], [address] [place] [id number] Phone: [phone] Fax: [phone]      Your procedure is scheduled on July 14th.  Report to [facility] Main Entrance "A" at 5:30 A.M., and check in at the Admitting office.  Call this number if you have problems the morning of surgery:  [phone]  Call [phone] if you have any questions prior to your surgery date Monday-Friday 8am-4pm    Remember:  Do not eat after midnight the night before your surgery  You may drink clear liquids until 4:30 AM the morning of your surgery.   Clear liquids allowed are: Water, Non-Citrus Juices (without pulp), Carbonated Beverages, Clear Tea, Black Coffee Only, and Gatorade  Please complete your PRE-SURGERY Gatorade that was provided to you by ... the morning of surgery.  Please, if able, drink it in one setting. DO NOT SIP.     Take these medicines the morning of surgery with A SIP OF WATER   Buproprion (Wellbutrin)  Escitalopram (Lexapro)    7 days prior to surgery STOP taking any Aspirin (unless otherwise instructed by your surgeon), Aleve, Naproxen, Ibuprofen, Motrin, Advil, Goody's, BC's, all herbal medications, fish oil, and all vitamins.         Stop phentermine.   WHAT DO I DO ABOUT MY DIABETES MEDICATION?   Marland Kitchen Do not take oral diabetes medicines (pills) the morning of surgery. - Metformin       The Morning of Surgery  Do not wear jewelry, make-up or nail polish.  Do not wear lotions, powders, or perfumes/colognes, or deodorant  Do not shave 48 hours prior to surgery.  Men may shave face and neck.  Do not bring valuables to the hospital.  [facility] is not responsible for any belongings or valuables.    Remember that you must have someone to transport you home after your surgery, and remain with you for 24 hours if you are discharged the same day.   Contacts, glasses, hearing aids,  dentures or bridgework may not be worn into surgery.    Leave your suitcase in the car.  After surgery it may be brought to your room.  For patients admitted to the hospital, discharge time will be determined by your treatment team.  Patients discharged the day of surgery will not be allowed to drive home.    Special instructions:   [place]- Preparing For Surgery  Before surgery, you can play an important role. Because skin is not sterile, your skin needs to be as free of germs as possible. You can reduce the number of germs on your skin by washing with CHG (chlorahexidine gluconate) Soap before surgery.  CHG is an antiseptic cleaner which kills germs and bonds with the skin to continue killing germs even after washing.    Oral Hygiene is also important to reduce your risk of infection.  Remember - BRUSH YOUR TEETH THE MORNING OF SURGERY WITH YOUR REGULAR TOOTHPASTE  Please do not use if you have an allergy to CHG or antibacterial soaps. If your skin becomes reddened/irritated stop using the CHG.  Do not shave (including legs and underarms) for at least 48 hours prior to first CHG shower. It is OK to shave your face.  Please follow these instructions carefully.   1. Shower the NIGHT BEFORE SURGERY and the MORNING OF SURGERY with CHG Soap.   2. If  you chose to wash your hair, wash your hair first as usual with your normal shampoo.  3. After you shampoo, rinse your hair and body thoroughly to remove the shampoo.  4. Use CHG as you would any other liquid soap. You can apply CHG directly to the skin and wash gently with a scrungie or a clean washcloth.   5. Apply the CHG Soap to your body ONLY FROM THE NECK DOWN.  Do not use on open wounds or open sores. Avoid contact with your eyes, ears, mouth and genitals (private parts). Wash Face and genitals (private parts)  with your normal soap.   6. Wash thoroughly, paying special attention to the area where your surgery will be  performed.  7. Thoroughly rinse your body with warm water from the neck down.  8. DO NOT shower/wash with your normal soap after using and rinsing off the CHG Soap.  9. Pat yourself dry with a CLEAN TOWEL.  10. Wear CLEAN PAJAMAS to bed the night before surgery, wear comfortable clothes the morning of surgery  11. Place CLEAN SHEETS on your bed the night of your first shower and DO NOT SLEEP WITH PETS.    Day of Surgery:  Do not apply any deodorants/lotions. Please shower the morning of surgery with the CHG soap  Please wear clean clothes to the hospital/surgery center.   Remember to brush your teeth WITH YOUR REGULAR TOOTHPASTE.   Please read over the following fact sheets that you were given.

## 2018-12-16 ENCOUNTER — Inpatient Hospital Stay: Payer: Federal, State, Local not specified - PPO | Admitting: Orthopedic Surgery

## 2018-12-16 ENCOUNTER — Other Ambulatory Visit: Payer: Self-pay

## 2018-12-16 LAB — URINE CULTURE: Culture: <10000 — AB

## 2018-12-16 MED ORDER — POTASSIUM CHLORIDE ER 10 MEQ PO TBCR: EXTENDED_RELEASE_TABLET | ORAL | 0 refills | Status: AC

## 2018-12-17 ENCOUNTER — Other Ambulatory Visit: Payer: Self-pay

## 2018-12-17 ENCOUNTER — Encounter: Payer: Self-pay | Admitting: Orthopedic Surgery

## 2018-12-17 NOTE — Telephone Encounter (Signed)
Pls send order for needed equipment

## 2018-12-18 ENCOUNTER — Other Ambulatory Visit (HOSPITAL_COMMUNITY)
Admission: RE | Admit: 2018-12-18 | Discharge: 2018-12-18 | Disposition: A | Discharge status: Home / Self Care | Source: Ambulatory Visit | Attending: Orthopedic Surgery | Admitting: Orthopedic Surgery

## 2018-12-18 DIAGNOSIS — Z01818 Encounter for other preprocedural examination: Secondary | ICD-10-CM | POA: Diagnosis not present

## 2018-12-18 LAB — SARS CORONAVIRUS 2 (TAT 6-24 HRS): SARS Coronavirus 2: NEGATIVE

## 2018-12-22 ENCOUNTER — Encounter (HOSPITAL_COMMUNITY)
Admission: RE | Disposition: A | Discharge status: Home / Self Care | Payer: Self-pay | Source: Ambulatory Visit | Attending: Orthopedic Surgery

## 2018-12-22 ENCOUNTER — Inpatient Hospital Stay (HOSPITAL_COMMUNITY): Admitting: Certified Registered Nurse Anesthetist

## 2018-12-22 ENCOUNTER — Inpatient Hospital Stay (HOSPITAL_COMMUNITY): Admitting: Physician Assistant

## 2018-12-22 ENCOUNTER — Observation Stay (HOSPITAL_COMMUNITY)
Admission: RE | Admit: 2018-12-22 | Discharge: 2018-12-24 | Disposition: A | Discharge status: Home / Self Care | Source: Ambulatory Visit | Attending: Orthopedic Surgery | Admitting: Orthopedic Surgery

## 2018-12-22 ENCOUNTER — Other Ambulatory Visit: Payer: Self-pay

## 2018-12-22 ENCOUNTER — Encounter (HOSPITAL_COMMUNITY): Payer: Self-pay | Admitting: *Deleted

## 2018-12-22 DIAGNOSIS — M1711 Unilateral primary osteoarthritis, right knee: Secondary | ICD-10-CM | POA: Diagnosis not present

## 2018-12-22 DIAGNOSIS — Z90710 Acquired absence of both cervix and uterus: Secondary | ICD-10-CM | POA: Diagnosis not present

## 2018-12-22 DIAGNOSIS — Z7982 Long term (current) use of aspirin: Secondary | ICD-10-CM | POA: Insufficient documentation

## 2018-12-22 DIAGNOSIS — F419 Anxiety disorder, unspecified: Secondary | ICD-10-CM | POA: Insufficient documentation

## 2018-12-22 DIAGNOSIS — Z79899 Other long term (current) drug therapy: Secondary | ICD-10-CM | POA: Insufficient documentation

## 2018-12-22 DIAGNOSIS — Z7984 Long term (current) use of oral hypoglycemic drugs: Secondary | ICD-10-CM | POA: Diagnosis not present

## 2018-12-22 DIAGNOSIS — I10 Essential (primary) hypertension: Secondary | ICD-10-CM | POA: Diagnosis not present

## 2018-12-22 DIAGNOSIS — M1710 Unilateral primary osteoarthritis, unspecified knee: Secondary | ICD-10-CM | POA: Diagnosis present

## 2018-12-22 DIAGNOSIS — F329 Major depressive disorder, single episode, unspecified: Secondary | ICD-10-CM | POA: Diagnosis not present

## 2018-12-22 DIAGNOSIS — R7303 Prediabetes: Secondary | ICD-10-CM | POA: Diagnosis not present

## 2018-12-22 HISTORY — PX: TOTAL KNEE ARTHROPLASTY: SHX125

## 2018-12-22 LAB — GLUCOSE, CAPILLARY
Glucose-Capillary: 87 mg/dL (ref 70–99)
Glucose-Capillary: 107 mg/dL — ABNORMAL HIGH (ref 70–99)

## 2018-12-22 SURGERY — ARTHROPLASTY, KNEE, TOTAL
Anesthesia: Regional | Site: Knee | Laterality: Right

## 2018-12-22 MED ORDER — DEXAMETHASONE SODIUM PHOSPHATE 10 MG/ML IJ SOLN
INTRAMUSCULAR | Status: DC | PRN
  Administered 2018-12-22: 10 mg via INTRAVENOUS

## 2018-12-22 MED ORDER — METOCLOPRAMIDE HCL 5 MG/ML IJ SOLN: 5.0000 mg | Freq: Three times a day (TID) | INTRAMUSCULAR | Status: DC | PRN

## 2018-12-22 MED ORDER — SODIUM CHLORIDE 0.9 % IR SOLN
Status: DC | PRN
  Administered 2018-12-22: 3000 mL

## 2018-12-22 MED ORDER — BUPIVACAINE HCL (PF) 0.25 % IJ SOLN
INTRAMUSCULAR | Status: AC
  Filled 2018-12-22: qty 30

## 2018-12-22 MED ORDER — BUPIVACAINE-EPINEPHRINE (PF) 0.5% -1:200000 IJ SOLN
INTRAMUSCULAR | Status: DC | PRN
  Administered 2018-12-22: 20 mL via PERINEURAL

## 2018-12-22 MED ORDER — LIDOCAINE 2% (20 MG/ML) 5 ML SYRINGE
INTRAMUSCULAR | Status: AC
  Filled 2018-12-22: qty 5

## 2018-12-22 MED ORDER — PHENTERMINE HCL 37.5 MG PO CAPS: 37.5000 mg | ORAL_CAPSULE | Freq: Every day | ORAL | Status: DC

## 2018-12-22 MED ORDER — METFORMIN HCL 500 MG PO TABS
500.0000 mg | ORAL_TABLET | Freq: Every day | ORAL | Status: DC
  Administered 2018-12-23 – 2018-12-24 (×2): 500 mg via ORAL
  Filled 2018-12-22 (×2): qty 1

## 2018-12-22 MED ORDER — METHOCARBAMOL 500 MG PO TABS
500.0000 mg | ORAL_TABLET | Freq: Four times a day (QID) | ORAL | Status: DC | PRN
  Administered 2018-12-22 – 2018-12-24 (×4): 500 mg via ORAL
  Filled 2018-12-22 (×3): qty 1

## 2018-12-22 MED ORDER — SUCCINYLCHOLINE CHLORIDE 200 MG/10ML IV SOSY
PREFILLED_SYRINGE | INTRAVENOUS | Status: DC | PRN
  Administered 2018-12-22: 140 mg via INTRAVENOUS

## 2018-12-22 MED ORDER — ONDANSETRON HCL 4 MG/2ML IJ SOLN
INTRAMUSCULAR | Status: DC | PRN
  Administered 2018-12-22: 4 mg via INTRAVENOUS

## 2018-12-22 MED ORDER — DEXAMETHASONE SODIUM PHOSPHATE 10 MG/ML IJ SOLN
INTRAMUSCULAR | Status: AC
  Filled 2018-12-22: qty 1

## 2018-12-22 MED ORDER — ESCITALOPRAM OXALATE 10 MG PO TABS
20.0000 mg | ORAL_TABLET | Freq: Every day | ORAL | Status: DC
  Administered 2018-12-22 – 2018-12-24 (×3): 20 mg via ORAL
  Filled 2018-12-22 (×3): qty 2

## 2018-12-22 MED ORDER — LIDOCAINE 2% (20 MG/ML) 5 ML SYRINGE
INTRAMUSCULAR | Status: DC | PRN
  Administered 2018-12-22: 60 mg via INTRAVENOUS

## 2018-12-22 MED ORDER — METHOCARBAMOL 1000 MG/10ML IJ SOLN
500.0000 mg | Freq: Four times a day (QID) | INTRAVENOUS | Status: DC | PRN
  Filled 2018-12-22: qty 5

## 2018-12-22 MED ORDER — MORPHINE SULFATE (PF) 4 MG/ML IV SOLN
INTRAVENOUS | Status: DC | PRN
  Administered 2018-12-22: 8 mg

## 2018-12-22 MED ORDER — TRANEXAMIC ACID 1000 MG/10ML IV SOLN
2000.0000 mg | Freq: Once | INTRAVENOUS | Status: DC
  Filled 2018-12-22: qty 20

## 2018-12-22 MED ORDER — MIDAZOLAM HCL 2 MG/2ML IJ SOLN
INTRAMUSCULAR | Status: DC | PRN
  Administered 2018-12-22: 2 mg via INTRAVENOUS

## 2018-12-22 MED ORDER — CEFAZOLIN SODIUM-DEXTROSE 2-4 GM/100ML-% IV SOLN
2.0000 g | INTRAVENOUS | Status: AC
  Administered 2018-12-22: 2 g via INTRAVENOUS

## 2018-12-22 MED ORDER — CEFAZOLIN SODIUM-DEXTROSE 2-4 GM/100ML-% IV SOLN
2.0000 g | Freq: Four times a day (QID) | INTRAVENOUS | Status: AC
  Administered 2018-12-22 (×2): 2 g via INTRAVENOUS
  Filled 2018-12-22 (×2): qty 100

## 2018-12-22 MED ORDER — DOCUSATE SODIUM 100 MG PO CAPS
100.0000 mg | ORAL_CAPSULE | Freq: Two times a day (BID) | ORAL | Status: DC
  Administered 2018-12-22 – 2018-12-24 (×5): 100 mg via ORAL
  Filled 2018-12-22 (×5): qty 1

## 2018-12-22 MED ORDER — FENTANYL CITRATE (PF) 100 MCG/2ML IJ SOLN
25.0000 ug | INTRAMUSCULAR | Status: DC | PRN
  Administered 2018-12-22 (×2): 50 ug via INTRAVENOUS

## 2018-12-22 MED ORDER — FUROSEMIDE 20 MG PO TABS
20.0000 mg | ORAL_TABLET | Freq: Every day | ORAL | Status: DC
  Administered 2018-12-22 – 2018-12-24 (×3): 20 mg via ORAL
  Filled 2018-12-22 (×3): qty 1

## 2018-12-22 MED ORDER — OXYCODONE HCL 5 MG PO TABS
ORAL_TABLET | ORAL | Status: AC
  Filled 2018-12-22: qty 3

## 2018-12-22 MED ORDER — ADULT MULTIVITAMIN W/MINERALS CH
1.0000 | ORAL_TABLET | ORAL | Status: DC
  Administered 2018-12-23: 1 via ORAL
  Filled 2018-12-22: qty 1

## 2018-12-22 MED ORDER — MIDAZOLAM HCL 2 MG/2ML IJ SOLN
INTRAMUSCULAR | Status: AC
  Filled 2018-12-22: qty 2

## 2018-12-22 MED ORDER — 0.9 % SODIUM CHLORIDE (POUR BTL) OPTIME
TOPICAL | Status: DC | PRN
  Administered 2018-12-22 (×2): 1000 mL

## 2018-12-22 MED ORDER — OXYCODONE HCL 5 MG/5ML PO SOLN: 5.0000 mg | Freq: Once | ORAL | Status: AC | PRN

## 2018-12-22 MED ORDER — MORPHINE SULFATE (PF) 4 MG/ML IV SOLN
INTRAVENOUS | Status: AC
  Filled 2018-12-22: qty 2

## 2018-12-22 MED ORDER — ACETAMINOPHEN 325 MG PO TABS
325.0000 mg | ORAL_TABLET | Freq: Four times a day (QID) | ORAL | Status: DC | PRN
  Administered 2018-12-23 (×2): 650 mg via ORAL
  Filled 2018-12-22 (×2): qty 2

## 2018-12-22 MED ORDER — LACTATED RINGERS IV SOLN
INTRAVENOUS | Status: DC | PRN
  Administered 2018-12-22: 07:00:00 via INTRAVENOUS

## 2018-12-22 MED ORDER — CHLORHEXIDINE GLUCONATE 4 % EX LIQD: 60.0000 mL | Freq: Once | CUTANEOUS | Status: DC

## 2018-12-22 MED ORDER — CLONIDINE HCL (ANALGESIA) 100 MCG/ML EP SOLN
EPIDURAL | Status: AC
  Filled 2018-12-22: qty 10

## 2018-12-22 MED ORDER — BUPIVACAINE HCL 0.25 % IJ SOLN
INTRAMUSCULAR | Status: DC | PRN
  Administered 2018-12-22: 10 mL
  Administered 2018-12-22: 20 mL

## 2018-12-22 MED ORDER — LACTATED RINGERS IV SOLN
INTRAVENOUS | Status: AC
  Administered 2018-12-22 – 2018-12-23 (×2): via INTRAVENOUS

## 2018-12-22 MED ORDER — BUPROPION HCL ER (XL) 150 MG PO TB24
150.0000 mg | ORAL_TABLET | Freq: Every day | ORAL | Status: DC
  Administered 2018-12-22 – 2018-12-24 (×3): 150 mg via ORAL
  Filled 2018-12-22 (×3): qty 1

## 2018-12-22 MED ORDER — TRAZODONE HCL 50 MG PO TABS
50.0000 mg | ORAL_TABLET | Freq: Every day | ORAL | Status: DC
  Administered 2018-12-22 – 2018-12-23 (×2): 50 mg via ORAL
  Filled 2018-12-22 (×2): qty 1

## 2018-12-22 MED ORDER — FENTANYL CITRATE (PF) 100 MCG/2ML IJ SOLN
INTRAMUSCULAR | Status: AC
  Filled 2018-12-22: qty 2

## 2018-12-22 MED ORDER — FENTANYL CITRATE (PF) 250 MCG/5ML IJ SOLN
INTRAMUSCULAR | Status: DC | PRN
  Administered 2018-12-22: 100 ug via INTRAVENOUS
  Administered 2018-12-22 (×2): 25 ug via INTRAVENOUS
  Administered 2018-12-22: 50 ug via INTRAVENOUS
  Administered 2018-12-22 (×2): 25 ug via INTRAVENOUS

## 2018-12-22 MED ORDER — ASPIRIN 81 MG PO CHEW
81.0000 mg | CHEWABLE_TABLET | Freq: Two times a day (BID) | ORAL | Status: DC
  Administered 2018-12-22 – 2018-12-24 (×4): 81 mg via ORAL
  Filled 2018-12-22 (×4): qty 1

## 2018-12-22 MED ORDER — OXYCODONE HCL 5 MG PO TABS
5.0000 mg | ORAL_TABLET | Freq: Once | ORAL | Status: AC | PRN
  Administered 2018-12-22: 5 mg via ORAL

## 2018-12-22 MED ORDER — POVIDONE-IODINE 10 % EX SWAB: 2.0000 "application " | Freq: Once | CUTANEOUS | Status: DC

## 2018-12-22 MED ORDER — OXYCODONE HCL 5 MG PO TABS
5.0000 mg | ORAL_TABLET | ORAL | Status: DC | PRN
  Administered 2018-12-22 – 2018-12-24 (×5): 10 mg via ORAL
  Filled 2018-12-22 (×3): qty 2

## 2018-12-22 MED ORDER — BUPIVACAINE LIPOSOME 1.3 % IJ SUSP
20.0000 mL | Freq: Once | INTRAMUSCULAR | Status: DC
  Filled 2018-12-22: qty 20

## 2018-12-22 MED ORDER — SUCCINYLCHOLINE CHLORIDE 200 MG/10ML IV SOSY
PREFILLED_SYRINGE | INTRAVENOUS | Status: AC
  Filled 2018-12-22: qty 10

## 2018-12-22 MED ORDER — CELECOXIB 200 MG PO CAPS
200.0000 mg | ORAL_CAPSULE | Freq: Two times a day (BID) | ORAL | Status: DC
  Administered 2018-12-22 – 2018-12-24 (×5): 200 mg via ORAL
  Filled 2018-12-22 (×5): qty 1

## 2018-12-22 MED ORDER — ONDANSETRON HCL 4 MG/2ML IJ SOLN: 4.0000 mg | Freq: Four times a day (QID) | INTRAMUSCULAR | Status: DC | PRN

## 2018-12-22 MED ORDER — VITAMIN D 25 MCG (1000 UNIT) PO TABS
1000.0000 [IU] | ORAL_TABLET | Freq: Every day | ORAL | Status: DC
  Administered 2018-12-22 – 2018-12-24 (×3): 1000 [IU] via ORAL
  Filled 2018-12-22 (×3): qty 1

## 2018-12-22 MED ORDER — GLYCOPYRROLATE PF 0.2 MG/ML IJ SOSY
PREFILLED_SYRINGE | INTRAMUSCULAR | Status: DC | PRN
  Administered 2018-12-22: .2 mg via INTRAVENOUS

## 2018-12-22 MED ORDER — GABAPENTIN 300 MG PO CAPS
300.0000 mg | ORAL_CAPSULE | Freq: Three times a day (TID) | ORAL | Status: DC
  Administered 2018-12-22 – 2018-12-24 (×6): 300 mg via ORAL
  Filled 2018-12-22 (×7): qty 1

## 2018-12-22 MED ORDER — GLYCOPYRROLATE PF 0.2 MG/ML IJ SOSY
PREFILLED_SYRINGE | INTRAMUSCULAR | Status: AC
  Filled 2018-12-22: qty 1

## 2018-12-22 MED ORDER — HYDROCHLOROTHIAZIDE 25 MG PO TABS
25.0000 mg | ORAL_TABLET | Freq: Every day | ORAL | Status: DC
  Administered 2018-12-22 – 2018-12-24 (×3): 25 mg via ORAL
  Filled 2018-12-22 (×3): qty 1

## 2018-12-22 MED ORDER — BUPIVACAINE LIPOSOME 1.3 % IJ SUSP
INTRAMUSCULAR | Status: DC | PRN
  Administered 2018-12-22: 20 mL

## 2018-12-22 MED ORDER — ONDANSETRON HCL 4 MG PO TABS: 4.0000 mg | ORAL_TABLET | Freq: Four times a day (QID) | ORAL | Status: DC | PRN

## 2018-12-22 MED ORDER — METOCLOPRAMIDE HCL 5 MG PO TABS: 5.0000 mg | ORAL_TABLET | Freq: Three times a day (TID) | ORAL | Status: DC | PRN

## 2018-12-22 MED ORDER — PROPOFOL 10 MG/ML IV BOLUS
INTRAVENOUS | Status: AC
  Filled 2018-12-22: qty 20

## 2018-12-22 MED ORDER — ONDANSETRON HCL 4 MG/2ML IJ SOLN
INTRAMUSCULAR | Status: AC
  Filled 2018-12-22: qty 2

## 2018-12-22 MED ORDER — PROPOFOL 10 MG/ML IV BOLUS
INTRAVENOUS | Status: DC | PRN
  Administered 2018-12-22: 200 mg via INTRAVENOUS

## 2018-12-22 MED ORDER — METHOCARBAMOL 500 MG PO TABS
ORAL_TABLET | ORAL | Status: AC
  Filled 2018-12-22: qty 1

## 2018-12-22 MED ORDER — SODIUM CHLORIDE 0.9% FLUSH
INTRAVENOUS | Status: DC | PRN
  Administered 2018-12-22: 20 mL

## 2018-12-22 MED ORDER — TRANEXAMIC ACID-NACL 1000-0.7 MG/100ML-% IV SOLN
1000.0000 mg | INTRAVENOUS | Status: AC
  Administered 2018-12-22: 1000 mg via INTRAVENOUS
  Filled 2018-12-22: qty 100

## 2018-12-22 MED ORDER — PHENOL 1.4 % MT LIQD: 1.0000 | OROMUCOSAL | Status: DC | PRN

## 2018-12-22 MED ORDER — FENTANYL CITRATE (PF) 250 MCG/5ML IJ SOLN
INTRAMUSCULAR | Status: AC
  Filled 2018-12-22: qty 5

## 2018-12-22 MED ORDER — MENTHOL 3 MG MT LOZG: 1.0000 | LOZENGE | OROMUCOSAL | Status: DC | PRN

## 2018-12-22 MED ORDER — METFORMIN HCL ER 500 MG PO TB24
500.0000 mg | ORAL_TABLET | Freq: Every day | ORAL | Status: DC
  Filled 2018-12-22: qty 1

## 2018-12-22 MED ORDER — CEFAZOLIN SODIUM-DEXTROSE 2-4 GM/100ML-% IV SOLN
INTRAVENOUS | Status: AC
  Filled 2018-12-22: qty 100

## 2018-12-22 MED ORDER — CLONIDINE HCL (ANALGESIA) 100 MCG/ML EP SOLN
EPIDURAL | Status: DC | PRN
  Administered 2018-12-22: 1 mL

## 2018-12-22 MED ORDER — TRANEXAMIC ACID 1000 MG/10ML IV SOLN
INTRAVENOUS | Status: DC | PRN
  Administered 2018-12-22: 08:00:00 2000 mg via TOPICAL

## 2018-12-22 MED ORDER — HYDROMORPHONE HCL 1 MG/ML IJ SOLN
0.5000 mg | INTRAMUSCULAR | Status: DC | PRN
  Administered 2018-12-23 (×2): 0.5 mg via INTRAVENOUS
  Filled 2018-12-22 (×2): qty 1

## 2018-12-22 SURGICAL SUPPLY — 85 items
BAG DECANTER FOR FLEXI CONT (MISCELLANEOUS) ×3 IMPLANT
BANDAGE ESMARK 6X9 LF (GAUZE/BANDAGES/DRESSINGS) ×1 IMPLANT
BLADE SAG 18X100X1.27 (BLADE) ×5 IMPLANT
BLADE SAW SGTL 13.0X1.19X90.0M (BLADE) IMPLANT
BLADE SURG 10 STRL SS (BLADE) ×2 IMPLANT
BNDG COHESIVE 6X5 TAN STRL LF (GAUZE/BANDAGES/DRESSINGS) ×3 IMPLANT
BNDG ELASTIC 6X15 VLCR STRL LF (GAUZE/BANDAGES/DRESSINGS) ×3 IMPLANT
BNDG ESMARK 6X9 LF (GAUZE/BANDAGES/DRESSINGS) ×3
BOWL SMART MIX CTS (DISPOSABLE) IMPLANT
CLOSURE STERI-STRIP 1/2X4 (GAUZE/BANDAGES/DRESSINGS) ×1
CLOSURE WOUND 1/2 X4 (GAUZE/BANDAGES/DRESSINGS) ×2
CLSR STERI-STRIP ANTIMIC 1/2X4 (GAUZE/BANDAGES/DRESSINGS) ×1 IMPLANT
COMPONENT TRI CR FEM SZ5 KNEE (Orthopedic Implant) IMPLANT
CONT SPECI 4OZ STER CLIK (MISCELLANEOUS) ×3 IMPLANT
COVER SURGICAL LIGHT HANDLE (MISCELLANEOUS) ×3 IMPLANT
COVER WAND RF STERILE (DRAPES) ×3 IMPLANT
CUFF TOURN SGL QUICK 34 (TOURNIQUET CUFF) ×2
CUFF TOURN SGL QUICK 42 (TOURNIQUET CUFF) IMPLANT
CUFF TRNQT CYL 34X4.125X (TOURNIQUET CUFF) ×1 IMPLANT
DECANTER SPIKE VIAL GLASS SM (MISCELLANEOUS) ×3 IMPLANT
DRAPE INCISE IOBAN 66X45 STRL (DRAPES) IMPLANT
DRAPE ORTHO SPLIT 77X108 STRL (DRAPES) ×6
DRAPE SURG ORHT 6 SPLT 77X108 (DRAPES) ×3 IMPLANT
DRAPE U-SHAPE 47X51 STRL (DRAPES) ×3 IMPLANT
DRSG AQUACEL AG ADV 3.5X10 (GAUZE/BANDAGES/DRESSINGS) ×2 IMPLANT
DRSG AQUACEL AG ADV 3.5X14 (GAUZE/BANDAGES/DRESSINGS) IMPLANT
DURAPREP 26ML APPLICATOR (WOUND CARE) ×6 IMPLANT
ELECT CAUTERY BLADE 6.4 (BLADE) ×3 IMPLANT
ELECT REM PT RETURN 9FT ADLT (ELECTROSURGICAL) ×3
ELECTRODE REM PT RTRN 9FT ADLT (ELECTROSURGICAL) ×1 IMPLANT
GAUZE SPONGE 4X4 12PLY STRL (GAUZE/BANDAGES/DRESSINGS) ×3 IMPLANT
GLOVE BIOGEL PI IND STRL 7.5 (GLOVE) ×1 IMPLANT
GLOVE BIOGEL PI IND STRL 8 (GLOVE) ×1 IMPLANT
GLOVE BIOGEL PI INDICATOR 7.5 (GLOVE) ×2
GLOVE BIOGEL PI INDICATOR 8 (GLOVE) ×2
GLOVE ECLIPSE 7.0 STRL STRAW (GLOVE) ×3 IMPLANT
GLOVE SURG ORTHO 8.0 STRL STRW (GLOVE) ×3 IMPLANT
GOWN STRL REUS W/ TWL LRG LVL3 (GOWN DISPOSABLE) ×3 IMPLANT
GOWN STRL REUS W/TWL LRG LVL3 (GOWN DISPOSABLE) ×6
HANDPIECE INTERPULSE COAX TIP (DISPOSABLE) ×2
HOOD PEEL AWAY FLYTE STAYCOOL (MISCELLANEOUS) ×9 IMPLANT
IMMOBILIZER KNEE 20 (SOFTGOODS) IMPLANT
IMMOBILIZER KNEE 22 (SOFTGOODS) ×2 IMPLANT
IMMOBILIZER KNEE 22 UNIV (SOFTGOODS) IMPLANT
IMMOBILIZER KNEE 24 THIGH 36 (MISCELLANEOUS) IMPLANT
IMMOBILIZER KNEE 24 UNIV (MISCELLANEOUS)
INSERT TIB BEARING SZ 5 10 (Insert) ×2 IMPLANT
JET LAVAGE IRRISEPT WOUND (IRRIGATION / IRRIGATOR) ×3
KIT BASIN OR (CUSTOM PROCEDURE TRAY) ×3 IMPLANT
KIT TURNOVER KIT B (KITS) ×3 IMPLANT
KNEE PATELLA ASYMMETRIC 10X32 (Knees) ×2 IMPLANT
KNEE TIBIAL COMPONENT SZ5 (Knees) ×2 IMPLANT
LAVAGE JET IRRISEPT WOUND (IRRIGATION / IRRIGATOR) IMPLANT
MANIFOLD NEPTUNE II (INSTRUMENTS) ×3 IMPLANT
NDL SAFETY ECLIPSE 18X1.5 (NEEDLE) ×1 IMPLANT
NDL SPNL 18GX3.5 QUINCKE PK (NEEDLE) IMPLANT
NEEDLE 22X1 1/2 (OR ONLY) (NEEDLE) ×6 IMPLANT
NEEDLE HYPO 18GX1.5 SHARP (NEEDLE) ×2
NEEDLE SPNL 18GX3.5 QUINCKE PK (NEEDLE) ×3 IMPLANT
NS IRRIG 1000ML POUR BTL (IV SOLUTION) ×6 IMPLANT
PACK TOTAL JOINT (CUSTOM PROCEDURE TRAY) ×3 IMPLANT
PAD ARMBOARD 7.5X6 YLW CONV (MISCELLANEOUS) ×6 IMPLANT
PAD CAST 4YDX4 CTTN HI CHSV (CAST SUPPLIES) ×1 IMPLANT
PADDING CAST COTTON 4X4 STRL (CAST SUPPLIES) ×2
PADDING CAST COTTON 6X4 STRL (CAST SUPPLIES) ×3 IMPLANT
PIN FLUTED HEDLESS FIX 3.5X1/8 (PIN) ×2 IMPLANT
SET HNDPC FAN SPRY TIP SCT (DISPOSABLE) ×1 IMPLANT
STRIP CLOSURE SKIN 1/2X4 (GAUZE/BANDAGES/DRESSINGS) ×4 IMPLANT
SUCTION FRAZIER HANDLE 10FR (MISCELLANEOUS) ×2
SUCTION TUBE FRAZIER 10FR DISP (MISCELLANEOUS) ×1 IMPLANT
SUT MNCRL AB 3-0 PS2 18 (SUTURE) ×3 IMPLANT
SUT VIC AB 0 CT1 27 (SUTURE) ×6
SUT VIC AB 0 CT1 27XBRD ANBCTR (SUTURE) ×3 IMPLANT
SUT VIC AB 1 CT1 27 (SUTURE) ×10
SUT VIC AB 1 CT1 27XBRD ANBCTR (SUTURE) ×5 IMPLANT
SUT VIC AB 2-0 CT1 27 (SUTURE) ×8
SUT VIC AB 2-0 CT1 TAPERPNT 27 (SUTURE) ×4 IMPLANT
SUT VICRYL 0 UR6 27IN ABS (SUTURE) ×2 IMPLANT
SYR 30ML LL (SYRINGE) ×9 IMPLANT
SYR TB 1ML LUER SLIP (SYRINGE) ×3 IMPLANT
TOWEL GREEN STERILE (TOWEL DISPOSABLE) ×6 IMPLANT
TOWEL GREEN STERILE FF (TOWEL DISPOSABLE) ×6 IMPLANT
TRAY CATH 16FR W/PLASTIC CATH (SET/KITS/TRAYS/PACK) IMPLANT
TRI CRUC RET FEM SZ5 KNEE (Orthopedic Implant) ×3 IMPLANT
WATER STERILE IRR 1000ML POUR (IV SOLUTION) IMPLANT

## 2018-12-22 NOTE — Progress Notes (Signed)
Orthopedic Tech Progress Note Patient Details:  Beverly Salazar January 17, 1966 [id number]  CPM Right Knee CPM Right Knee: On Right Knee Flexion (Degrees): 40 Right Knee Extension (Degrees): 0  Post Interventions Patient Tolerated: Well Instructions Provided: Care of device, Adjustment of device Ortho Devices Type of Ortho Device: Bone foam zero knee Ortho Device/Splint Interventions: Other (comment)   Post Interventions Patient Tolerated: Well Instructions Provided: Care of device, Adjustment of device   [name] 12/22/2018, 11:45 AM

## 2018-12-22 NOTE — Anesthesia Procedure Notes (Signed)
Procedure Name: Intubation Date/Time: 12/22/2018 7:55 AM Performed by: Kathryne Hitch, CRNA Pre-anesthesia Checklist: Patient identified, Emergency Drugs available, Suction available and Patient being monitored Patient Re-evaluated:Patient Re-evaluated prior to induction Oxygen Delivery Method: Circle system utilized Preoxygenation: Pre-oxygenation with 100% oxygen Induction Type: IV induction Laryngoscope Size: Miller and 2 Grade View: Grade I Tube type: Oral Tube size: 7.0 mm Number of attempts: 1 Airway Equipment and Method: Stylet and Oral airway Placement Confirmation: ETT inserted through vocal cords under direct vision,  positive ETCO2 and breath sounds checked- equal and bilateral Secured at: 22 cm Tube secured with: Tape Dental Injury: Teeth and Oropharynx as per pre-operative assessment

## 2018-12-22 NOTE — Progress Notes (Signed)
Orthopedic Tech Progress Note Patient Details:  Beverly Salazar January 08, 1966 [id number] While upstairs taking care of another patient, the RN showed me where the patient had an order for an Over Head Frame with Trapeze. So I went ahead an applied frame Patient ID: Beverly Salazar, female   DOB: 1966-01-07, 53 y.o.   MRN: [id number]   [name] 12/22/2018, 5:48 PM

## 2018-12-22 NOTE — Progress Notes (Signed)
Right foot perfused and mobile and sensate CPM and PT and mobilization Dc tomorrow vs Thursday

## 2018-12-22 NOTE — Op Note (Signed)
NAME: Beverly Salazar, Beverly D. MEDICAL RECORD [id number] ACCOUNT [id number] DATE OF BIRTH:April 18, 1966 FACILITY: [facility] LOCATION: [facility]-PERI[facility] PHYSICIAN:GREGORY Diamantina ProvidenceS. DEAN, MD  OPERATIVE REPORT  DATE OF PROCEDURE:  12/22/2018  PREOPERATIVE DIAGNOSIS:  Right knee arthritis.  POSTOPERATIVE DIAGNOSIS:  Right knee arthritis.  PROCEDURE:  Right total knee replacement.  SURGEON:  Cammy CopaGregory Scott Dean, MD  ASSISTANT:  Jackie PlumLuke Magnet, PA, and Patrick Jupiterarla Bethune, RNFA  INDICATIONS:  This is a 53 year old patient with end-stage right knee arthritis who presents for operative management after explanation of risks and benefits.  Components utilized include press-fit Stryker PCL retaining 5 femur 5 tibia 10 mm spacer deep dish with 32 mm press-fit patella  PROCEDURE IN DETAIL:  The patient was brought to the operating room where general anesthetic was induced.  Preoperative antibiotics were administered.  Time-out was called.  Right leg was prescrubbed with alcohol and Betadine, allowed to air dry, prepped  with a DuraPrep solution, and draped in a sterile manner.  Collier Flowersoban was used to cover the operative field.  The leg was then elevated and exsanguinated with the Esmarch wrap.  Tourniquet was inflated to 300 mmHg.  An anterior approach to the knee was made.   Skin and subcutaneous tissue were sharply divided.  Median parapatellar approach was made.  Significant osteophytosis was present on both the femur and the tibia, but more on the femur, predominantly on the medial side.  The precise location of the  arthrotomy was marked with #1 Vicryl suture.  Fat pad partially excised.  Soft tissue removed from the anterior distal femur.  The lateral patellofemoral ligament was released.  Overall, the patient had a valgus deformity in her knee with more  lateral-sided wear than medial-sided wear.  Using intramedullary alignment, a cut was made perpendicular to the mechanical axis with a slight posterior tilt.  The PCL was preserved.  The  9 mm cut was made off the least affected medial tibial plateau.   This gave an adequate resection.  Bone quality was excellent.  Attention then directed towards the femur.  Intramedullary alignment was then used to make a 5-degree valgus cut.  The femur was then sized to a size 5.  The tibia was also sized to a size 5.   Anterior, posterior, and chamfer cuts were then made.  Bone quality again excellent on the femur.  The tibia was keel punched.  Trial components were placed with the tibia in position.  A 5 tibia with a 5 femur gave very good press fit.  9 and 11 mm  spacers were utilized, and the 10 mm spacer was decided upon which gave full extension as well as very nice flexion and extension with good patellar tracking.  At this time, patella was cut down from 26 down to a 14.  A 32 asymmetric patella was then  placed.  A good press fit was obtained.  With trial components in position, the 11 mm spacer gave good stability to varus and valgus stress at 0 and 30 degrees, but had slight liftoff with flexion past 100 degrees.  A 10 mm spacer was thus chosen.   Thorough irrigation was performed.  Tranexamic acid sponge was allowed to soak within the incision for 3 minutes.  The IrriSept solution was also utilized at all times during the case for irrigation.  Following this, the Exparel-Marcaine saline solution  was injected into the capsule.  Then, the true components were placed with excellent press fit obtained.  A 10 mm spacer was placed, and  the patient had full extension, full flexion, with very good stability to varus and valgus stress at 0 and 30  degrees.  No mid flexion instability present.  Patella tracked well using no-thumbs technique.  At this time, tourniquet was released.  Bleeding points encountered, controlled using electrocautery.  Arthrotomy was closed using #1 Vicryl suture followed  by interrupted inverted 0 Vicryl suture, 2-0 Vicryl suture, and a 3-0 Monocryl.  Steri-Strips and an Aquacel  dressing were placed along with a solution of Marcaine, morphine, clonidine injected into the knee.  Ace wrap plus knee immobilizer placed.  The  patient tolerated the procedure well without immediate complication.  LN/NUANCE  D:12/22/2018 T:12/22/2018 JOB:[id number]

## 2018-12-22 NOTE — Anesthesia Preprocedure Evaluation (Signed)
Anesthesia Evaluation  Patient identified by MRN, date of birth, ID band Patient awake    Reviewed: Allergy & Precautions, H&P , NPO status , Patient's Chart, lab work & pertinent test results  Airway Mallampati: II   Neck ROM: full    Dental   Pulmonary neg pulmonary ROS,    breath sounds clear to auscultation       Cardiovascular hypertension,  Rhythm:regular Rate:Normal     Neuro/Psych PSYCHIATRIC DISORDERS Anxiety Depression    GI/Hepatic   Endo/Other    Renal/GU      Musculoskeletal  (+) Arthritis ,   Abdominal   Peds  Hematology   Anesthesia Other Findings   Reproductive/Obstetrics                             Anesthesia Physical Anesthesia Plan  ASA: II  Anesthesia Plan: Spinal and MAC   Post-op Pain Management:  Regional for Post-op pain   Induction: Intravenous  PONV Risk Score and Plan: 2 and Ondansetron, Propofol infusion, Midazolam and Treatment may vary due to age or medical condition  Airway Management Planned: Simple Face Mask  Additional Equipment:   Intra-op Plan:   Post-operative Plan:   Informed Consent: I have reviewed the patients History and Physical, chart, labs and discussed the procedure including the risks, benefits and alternatives for the proposed anesthesia with the patient or authorized representative who has indicated his/her understanding and acceptance.       Plan Discussed with: CRNA, Anesthesiologist and Surgeon  Anesthesia Plan Comments:         Anesthesia Quick Evaluation

## 2018-12-22 NOTE — Anesthesia Procedure Notes (Signed)
Anesthesia Regional Block: Adductor canal block   Pre-Anesthetic Checklist: ,, timeout performed, Correct Patient, Correct Site, Correct Laterality, Correct Procedure, Correct Position, site marked, Risks and benefits discussed,  Surgical consent,  Pre-op evaluation,  At surgeon's request and post-op pain management  Laterality: Right  Prep: chloraprep       Needles:  Injection technique: Single-shot  Needle Type: Echogenic Needle     Needle Length: 9cm  Needle Gauge: 21     Additional Needles:   Narrative:  Start time: 12/22/2018 7:00 AM End time: 12/22/2018 7:07 AM Injection made incrementally with aspirations every 5 mL.  Performed by: Personally  Anesthesiologist: Albertha Ghee, MD  Additional Notes: Pt tolerated the procedure well.

## 2018-12-22 NOTE — H&P (Signed)
TOTAL KNEE ADMISSION H&P  Patient is being admitted for right total knee arthroplasty.  Subjective:  Chief Complaint:right knee pain.  HPI: Beverly Salazar, 53 y.o. female, has a history of pain and functional disability in the right knee due to arthritis and has failed non-surgical conservative treatments for greater than 12 weeks to includeNSAID's and/or analgesics, corticosteriod injections, viscosupplementation injections, use of assistive devices and activity modification.  Onset of symptoms was gradual, starting 8 years ago with gradually worsening course since that time. The patient noted prior procedures on the knee to include  arthroscopy and menisectomy on the right knee(s).  Patient currently rates pain in the right knee(s) at 9 out of 10 with activity. Patient has night pain, worsening of pain with activity and weight bearing, pain that interferes with activities of daily living, pain with passive range of motion, crepitus and joint swelling.  Patient has evidence of subchondral sclerosis and joint space narrowing by imaging studies. This patient has had Long history of right knee pain and left knee pain.  Arthroscopy gave her a temporary respite but now she reports worsening symptoms unrelieved by conservative measures. There is no active infection.  Patient Active Problem List   Diagnosis Date Noted  . Unilateral primary osteoarthritis, right knee 02/05/2017   Past Medical History:  Diagnosis Date  . Anemia    history of  . Anxiety   . Arthritis   . Depression   . Hypertension   . Pre-diabetes     Past Surgical History:  Procedure Laterality Date  . ABDOMINAL HYSTERECTOMY    . KNEE ARTHROSCOPY     right 2012, left 2018  . WISDOM TOOTH EXTRACTION      Current Facility-Administered Medications  Medication Dose Route Frequency Provider Last Rate Last Dose  . ceFAZolin (ANCEF) 2-4 GM/100ML-% IVPB           . ceFAZolin (ANCEF) IVPB 2g/100 mL premix  2 g Intravenous On Call  to OR Cammy Copaean, Rayelle Armor Scott, MD      . chlorhexidine (HIBICLENS) 4 % liquid 4 application  60 mL Topical Once Cammy Copaean, Qais Jowers Scott, MD      . chlorhexidine (HIBICLENS) 4 % liquid 4 application  60 mL Topical Once Cammy Copaean, Francheska Villeda Scott, MD      . povidone-iodine 10 % swab 2 application  2 application Topical Once August Saucerean, Corrie MckusickGregory Scott, MD       No Known Allergies  Social History   Tobacco Use  . Smoking status: Never Smoker  . Smokeless tobacco: Never Used  Substance Use Topics  . Alcohol use: No    History reviewed. No pertinent family history.   Review of Systems  Musculoskeletal: Positive for joint pain.  All other systems reviewed and are negative.   Objective:  Physical Exam  Constitutional: She appears well-developed.  HENT:  Head: Normocephalic.  Eyes: Pupils are equal, round, and reactive to light.  Neck: Normal range of motion.  Cardiovascular: Normal rate.  Respiratory: Effort normal.  Neurological: She is alert.  Skin: Skin is warm.  Psychiatric: She has a normal mood and affect.   Ortho exam demonstrates stable collateral cruciate ligaments.  Has near full extension and flexion to past 90 degrees.  Patellofemoral crepitus is present.  Pedal pulses are palpable.  No masses lymphadenopathy or skin changes noted in that right knee region Vital signs in last 24 hours: Temp:  [98.2 F (36.8 C)] 98.2 F (36.8 C) (07/14 0630) Pulse Rate:  [58] 58 (07/14 0630)  Resp:  [18] 18 (07/14 0630) BP: (128)/(82) 128/82 (07/14 0630) SpO2:  [100 %] 100 % (07/14 0630) Weight:  [114.9 kg] 114.9 kg (07/14 0630)  Labs:   Estimated body mass index is 38.51 kg/m as calculated from the following:   Height as of this encounter: 5\' 8"  (1.727 m).   Weight as of this encounter: 114.9 kg.   Imaging Review Plain radiographs demonstrate moderate degenerative joint disease of the right knee(s). The overall alignment isneutral. The bone quality appears to be good for age and reported activity  level.      Assessment/Plan:  End stage arthritis, right knee   The patient history, physical examination, clinical judgment of the provider and imaging studies are consistent with end stage degenerative joint disease of the right knee(s) and total knee arthroplasty is deemed medically necessary. The treatment options including medical management, injection therapy arthroscopy and arthroplasty were discussed at length. The risks and benefits of total knee arthroplasty were presented and reviewed. The risks due to aseptic loosening, infection, stiffness, patella tracking problems, thromboembolic complications and other imponderables were discussed. The patient acknowledged the explanation, agreed to proceed with the plan and consent was signed. Patient is being admitted for inpatient treatment for surgery, pain control, PT, OT, prophylactic antibiotics, VTE prophylaxis, progressive ambulation and ADL's and discharge planning. The patient is planning to be discharged home with home health services     Patient's anticipated LOS is less than 2 midnights, meeting these requirements: - Younger than 22 - Lives within 1 hour of care - Has a competent adult at home to recover with post-op recover - NO history of  - Chronic pain requiring opiods  - Diabetes  - Coronary Artery Disease  - Heart failure  - Heart attack  - Stroke  - DVT/VTE  - Cardiac arrhythmia  - Respiratory Failure/COPD  - Renal failure  - Anemia  - Advanced Liver disease

## 2018-12-22 NOTE — Evaluation (Signed)
Physical Therapy Evaluation Patient Details Name: Beverly Salazar MRN: [id number] DOB: [birth year] Today's Date: 12/22/2018   History of Present Illness  Pt is a 53 y/o female s/p R TKA. PMH includes anxiety, HTN, and depression.   Clinical Impression  Pt admitted secondary to problem above with deficits below. Pt limited this session, as she experienced increased dizziness and nausea in sitting. Unable to attempt OOB mobility secondary to symptoms. Required min A to perform bed mobility. Reviewed knee precautions with pt. Will continue to follow acutely to maximize functional mobility independence and safety.     Follow Up Recommendations Follow surgeon's recommendation for DC plan and follow-up therapies;Supervision for mobility/OOB    Equipment Recommendations  None recommended by PT    Recommendations for Other Services       Precautions / Restrictions Precautions Precautions: Knee Precaution Booklet Issued: Yes (comment) Precaution Comments: Reviewed knee precautions with pt. Restrictions Weight Bearing Restrictions: Yes RLE Weight Bearing: Weight bearing as tolerated      Mobility  Bed Mobility Overal bed mobility: Needs Assistance Bed Mobility: Supine to Sit;Sit to Supine;Rolling Rolling: Supervision(with bed rails)   Supine to sit: Min assist Sit to supine: Min assist   General bed mobility comments: Min A for RLE assist to come to sitting. Pt with increased dizziness and nausea in sitting, therefore further mobility deferred. MinA for RLE assist for return to supine. Pt having to void, so rolled with supervision and use of bed rails for placement of bedpan, given pt current symptoms.   Transfers                    Ambulation/Gait                Stairs            Wheelchair Mobility    Modified Rankin (Stroke Patients Only)       Balance Overall balance assessment: Needs assistance Sitting-balance support: No upper extremity  supported;Feet supported Sitting balance-Leahy Scale: Fair                                       Pertinent Vitals/Pain Pain Assessment: Faces Faces Pain Scale: Hurts even more Pain Location: R knee  Pain Descriptors / Indicators: Aching;Operative site guarding Pain Intervention(s): Limited activity within patient's tolerance;Monitored during session;Repositioned    Home Living Family/patient expects to be discharged to:: Private residence Living Arrangements: Spouse/significant other Available Help at Discharge: Family Type of Home: House Home Access: Stairs to enter Entrance Stairs-Rails: Right Entrance Stairs-Number of Steps: 2 Home Layout: One level Home Equipment: Environmental consultantWalker - 2 wheels;Cane - single point;Bedside commode      Prior Function Level of Independence: Independent               Hand Dominance        Extremity/Trunk Assessment   Upper Extremity Assessment Upper Extremity Assessment: Defer to OT evaluation    Lower Extremity Assessment Lower Extremity Assessment: RLE deficits/detail RLE Deficits / Details: Deficits consistent with post op pain and weakness. Pt reporting some numbness in RLE.     Cervical / Trunk Assessment Cervical / Trunk Assessment: Normal  Communication   Communication: No difficulties  Cognition Arousal/Alertness: Lethargic Behavior During Therapy: WFL for tasks assessed/performed Overall Cognitive Status: Within Functional Limits for tasks assessed  General Comments: Pt very sleepy, however, was more awake when sitting upright.       General Comments General comments (skin integrity, edema, etc.): Pt complaining of R eye irritation; RN notified.     Exercises Total Joint Exercises Ankle Circles/Pumps: AROM;Both;10 reps;Supine   Assessment/Plan    PT Assessment Patient needs continued PT services  PT Problem List Decreased strength;Decreased  balance;Decreased activity tolerance;Decreased mobility;Decreased coordination;Decreased knowledge of use of DME;Decreased knowledge of precautions;Impaired sensation;Pain       PT Treatment Interventions DME instruction;Gait training;Stair training;Functional mobility training;Therapeutic activities;Therapeutic exercise;Balance training;Patient/family education    PT Goals (Current goals can be found in the Care Plan section)  Acute Rehab PT Goals Patient Stated Goal: to feel better PT Goal Formulation: With patient Time For Goal Achievement: 01/05/19 Potential to Achieve Goals: Good    Frequency 7X/week   Barriers to discharge        Co-evaluation               AM-PAC PT "6 Clicks" Mobility  Outcome Measure Help needed turning from your back to your side while in a flat bed without using bedrails?: A Little Help needed moving from lying on your back to sitting on the side of a flat bed without using bedrails?: A Little Help needed moving to and from a bed to a chair (including a wheelchair)?: A Lot Help needed standing up from a chair using your arms (e.g., wheelchair or bedside chair)?: A Lot Help needed to walk in hospital room?: A Lot Help needed climbing 3-5 steps with a railing? : A Lot 6 Click Score: 14    End of Session Equipment Utilized During Treatment: Right knee immobilizer Activity Tolerance: Treatment limited secondary to medical complications (Comment)(dizziness, nausea ) Patient left: in bed;with call bell/phone within reach Nurse Communication: Mobility status;Other (comment);Patient requests pain meds(Pt with nausea and R eye irritation) PT Visit Diagnosis: Other abnormalities of gait and mobility (R26.89);Pain;Difficulty in walking, not elsewhere classified (R26.2) Pain - Right/Left: Right Pain - part of body: Knee    Time: 3154-0086 PT Time Calculation (min) (ACUTE ONLY): 36 min   Charges:   PT Evaluation $PT Eval Low Complexity: 1 Low PT  Treatments $Therapeutic Activity: 8-22 mins        Leighton Ruff, PT, DPT  Acute Rehabilitation Services  Pager: [phone] Office: [phone]   Rudean Hitt 12/22/2018, 4:19 PM

## 2018-12-22 NOTE — Plan of Care (Signed)
  Problem: Pain Managment: Goal: General experience of comfort will improve Outcome: Progressing   Problem: Safety: Goal: Ability to remain free from injury will improve Outcome: Progressing   

## 2018-12-22 NOTE — Transfer of Care (Signed)
Immediate Anesthesia Transfer of Care Note  Patient: Beverly Salazar  Procedure(s) Performed: RIGHT TOTAL KNEE ARTHROPLASTY (Right Knee)  Patient Location: PACU  Anesthesia Type:General and regional  Level of Consciousness: awake and patient cooperative  Airway & Oxygen Therapy: Patient Spontanous Breathing  Post-op Assessment: Report given to RN and Post -op Vital signs reviewed and stable  Post vital signs: Reviewed and stable  Last Vitals:  Vitals Value Taken Time  BP    Temp    Pulse    Resp    SpO2      Last Pain:  Vitals:   12/22/18 0630  TempSrc: Oral  PainSc: 5       Patients Stated Pain Goal: 5 (08/67/61 9509)  Complications: No apparent anesthesia complications

## 2018-12-22 NOTE — Brief Op Note (Signed)
   12/22/2018  10:38 AM  PATIENT:  Beverly Salazar  53 y.o. female  PRE-OPERATIVE DIAGNOSIS:  right knee osteoarthritis  POST-OPERATIVE DIAGNOSIS:  right knee osteoarthritis  PROCEDURE:  Procedure(s): RIGHT TOTAL KNEE ARTHROPLASTY  SURGEON:  Surgeon(s): Meredith Pel, MD  ASSISTANT: Magnant pa bethune rnfa  ANESTHESIA:   general  EBL: 75 ml    Total I/O In: -  Out: 25 [Blood:25]  BLOOD ADMINISTERED: none  DRAINS: none   LOCAL MEDICATIONS USED:  Marcaine mso4 clonidine exparel  SPECIMEN:  No Specimen  COUNTS:  YES  TOURNIQUET:   Total Tourniquet Time Documented: Thigh (Right) - 87 minutes Total: Thigh (Right) - 87 minutes   DICTATION: .Other Dictation: Dictation Number [id number]  PLAN OF CARE: Admit for overnight observation  PATIENT DISPOSITION:  PACU - hemodynamically stable

## 2018-12-23 ENCOUNTER — Encounter (HOSPITAL_COMMUNITY): Payer: Self-pay | Admitting: Orthopedic Surgery

## 2018-12-23 DIAGNOSIS — M1711 Unilateral primary osteoarthritis, right knee: Secondary | ICD-10-CM | POA: Diagnosis not present

## 2018-12-23 MED ORDER — WHITE PETROLATUM EX OINT: TOPICAL_OINTMENT | CUTANEOUS | Status: DC | PRN

## 2018-12-23 MED ORDER — WHITE PETROLATUM EX OINT
TOPICAL_OINTMENT | CUTANEOUS | Status: AC
  Administered 2018-12-23: 0.2
  Filled 2018-12-23: qty 28.35

## 2018-12-23 NOTE — Progress Notes (Signed)
CSW acknowledges SNF consult, appears patient does not need this level of care at this time and will discharge home tomorrow. Please notify CSW for any further discharge planning needs.   [place], [place]

## 2018-12-23 NOTE — Progress Notes (Signed)
Patient is progressing well towards goals. She has completed HEP and stair training. This session focused on progressing ambulation distance. Pt reports she will d/c home tomorrow AM. Plan to continue gait training if pt is still admitted.   Benjiman Core, [place] Pager [phone] Acute Rehab   12/23/18 1521  PT Visit Information  Last PT Received On 12/23/18  Assistance Needed +1  History of Present Illness Pt is a 53 y/o female s/p R TKA. PMH includes anxiety, HTN, and depression.   Subjective Data  Patient Stated Goal to feel better  Precautions  Precautions Knee  Precaution Booklet Issued Yes (comment)  Precaution Comments reminded pt to keep knee straight in bed  Restrictions  Weight Bearing Restrictions Yes  RLE Weight Bearing WBAT  Pain Assessment  Pain Assessment Faces  Faces Pain Scale 4  Pain Location R knee with movement  Pain Descriptors / Indicators Aching;Operative site guarding  Pain Intervention(s) Monitored during session;Limited activity within patient's tolerance  Cognition  Arousal/Alertness Awake/alert  Behavior During Therapy WFL for tasks assessed/performed  Overall Cognitive Status Within Functional Limits for tasks assessed  Bed Mobility  Overal bed mobility Needs Assistance  Bed Mobility Supine to Sit;Sit to Supine  Supine to sit Supervision  Sit to supine Supervision  General bed mobility comments Using UEs to help progress R LE on and off bed.  Transfers  Overall transfer level Needs assistance  Equipment used Rolling walker (2 wheeled)  Transfers Sit to/from Stand  Sit to Stand Min guard  General transfer comment min guard for safety and VC for hand placement using RW. No physical assist required.  Ambulation/Gait  Ambulation/Gait assistance Min guard  Gait Distance (Feet) 125 Feet  Assistive device Rolling walker (2 wheeled)  Gait Pattern/deviations Step-to pattern;Decreased step length - left;Decreased stance time - right;Antalgic;Step-through  pattern  General Gait Details Pt beginning with step through pattern, but reverted back to step to as she fatigued. Cues for increased stance time on R LE.   Stairs Yes  Stairs assistance Min guard  Stair Management No rails  Number of Stairs 1  General stair comments Patient practiced 1 step to simulate home enviroment. Cues provided for technique and min guard for safety.   Balance  Overall balance assessment Needs assistance  Sitting-balance support No upper extremity supported;Feet supported  Sitting balance-Leahy Scale Good  Sitting balance - Comments able to don socks in seated position  Standing balance support Bilateral upper extremity supported  Standing balance-Leahy Scale Fair  Standing balance comment able to perform hand hygiene w/o UE support  Exercises  Exercises Total Joint  Total Joint Exercises  Heel Slides AROM;Right;10 reps;Seated  Long Arc Quad AROM;Right;10 reps;Seated  Knee Flexion AROM;Right;10 reps;Seated (5 sec holds)  PT - End of Session  Equipment Utilized During Treatment Gait belt  Activity Tolerance Patient tolerated treatment well;Patient limited by pain (dizziness, nausea )  Patient left with call bell/phone within reach;in bed  Nurse Communication Mobility status;Other (comment) (pt request for housekeeping)   PT - Assessment/Plan  PT Plan Current plan remains appropriate  PT Visit Diagnosis Other abnormalities of gait and mobility (R26.89);Pain;Difficulty in walking, not elsewhere classified (R26.2)  Pain - Right/Left Right  Pain - part of body Knee  PT Frequency (ACUTE ONLY) 7X/week  Follow Up Recommendations Follow surgeon's recommendation for DC plan and follow-up therapies;Supervision for mobility/OOB  PT equipment None recommended by PT  AM-PAC PT "6 Clicks" Mobility Outcome Measure (Version 2)  Help needed turning from your back to  your side while in a flat bed without using bedrails? 3  Help needed moving from lying on your back to  sitting on the side of a flat bed without using bedrails? 3  Help needed moving to and from a bed to a chair (including a wheelchair)? 3  Help needed standing up from a chair using your arms (e.g., wheelchair or bedside chair)? 3  Help needed to walk in hospital room? 3  Help needed climbing 3-5 steps with a railing?  2  6 Click Score 17  [facility]  PT Goal Progression  Progress towards PT goals Progressing toward goals  Acute Rehab PT Goals  PT Goal Formulation With patient  Time For Goal Achievement 01/05/19  Potential to Achieve Goals Good  PT Time Calculation  PT Start Time (ACUTE ONLY) 1432  PT Stop Time (ACUTE ONLY) 1506  PT Time Calculation (min) (ACUTE ONLY) 34 min  PT General Charges  $$ ACUTE PT VISIT 1 Visit  PT Treatments  $Gait Training 8-22 mins  $Therapeutic Exercise 8-22 mins

## 2018-12-23 NOTE — Evaluation (Signed)
Occupational Therapy Evaluation and Discharge Patient Details Name: Beverly Salazar MRN: [id number] DOB: [birth year] Today's Date: 12/23/2018    History of Present Illness Pt is a 53 y/o female s/p R TKA. PMH includes anxiety, HTN, and depression.    Clinical Impression   Pt is functioning at a min guard assist level in ADL and ADL transfers. All education completed with pt verbalizing understanding. No further OT needs.    Follow Up Recommendations  No OT follow up    Equipment Recommendations  None recommended by OT    Recommendations for Other Services       Precautions / Restrictions Precautions Precautions: Knee Precaution Booklet Issued: Yes (comment) Precaution Comments: reminded pt to keep knee straight in bed Restrictions Weight Bearing Restrictions: Yes RLE Weight Bearing: Weight bearing as tolerated      Mobility Bed Mobility Overal bed mobility: Needs Assistance Bed Mobility: Supine to Sit;Sit to Supine     Supine to sit: Supervision Sit to supine: Min assist   General bed mobility comments: assist for R LE back into bed  Transfers Overall transfer level: Needs assistance Equipment used: Rolling walker (2 wheeled) Transfers: Sit to/from Stand Sit to Stand: Min guard         General transfer comment: min guard for safety and VC for hand placement using RW. No physical assist required.    Balance Overall balance assessment: Needs assistance Sitting-balance support: No upper extremity supported;Feet supported Sitting balance-Leahy Scale: Good Sitting balance - Comments: able to don socks in seated position   Standing balance support: Bilateral upper extremity supported Standing balance-Leahy Scale: Poor Standing balance comment: reliant on UE support                           ADL either performed or assessed with clinical judgement   ADL Overall ADL's : Needs assistance/impaired Eating/Feeding: Independent;Bed level   Grooming:  Wash/dry hands;Standing;Min guard   Upper Body Bathing: Set up;Sitting   Lower Body Bathing: Min guard;Sit to/from stand   Upper Body Dressing : Set up;Sitting   Lower Body Dressing: Min guard;Sit to/from stand   Toilet Transfer: Min guard;RW;Ambulation;BSC   Toileting- Water quality scientist and Hygiene: Min guard;Sit to/from stand       Functional mobility during ADLs: Min guard;Rolling walker       Vision Patient Visual Report: No change from baseline       Perception     Praxis      Pertinent Vitals/Pain Pain Assessment: Faces Faces Pain Scale: Hurts even more Pain Location: R knee with movement Pain Descriptors / Indicators: Aching;Operative site guarding Pain Intervention(s): Monitored during session;Repositioned;Ice applied     Hand Dominance Right   Extremity/Trunk Assessment Upper Extremity Assessment Upper Extremity Assessment: Overall WFL for tasks assessed   Lower Extremity Assessment Lower Extremity Assessment: Defer to PT evaluation   Cervical / Trunk Assessment Cervical / Trunk Assessment: Normal   Communication Communication Communication: No difficulties   Cognition Arousal/Alertness: Awake/alert Behavior During Therapy: WFL for tasks assessed/performed Overall Cognitive Status: Within Functional Limits for tasks assessed                                     General Comments       Exercises Exercises: Total Joint Total Joint Exercises Ankle Circles/Pumps: AROM;Both;10 reps;Supine Quad Sets: AROM;Right;10 reps;Supine Towel Squeeze: AROM;Right;10 reps;Supine Short Arc Quad: AROM;Right;10  reps;Supine Heel Slides: AROM;Right;10 reps;Supine Hip ABduction/ADduction: AROM;Right;10 reps;Supine Straight Leg Raises: AROM;Right;10 reps;Supine   Shoulder Instructions      Home Living Family/patient expects to be discharged to:: Private residence Living Arrangements: Spouse/significant other Available Help at Discharge:  Family;Available 24 hours/day Type of Home: House Home Access: Stairs to enter Entergy CorporationEntrance Stairs-Number of Steps: 2 Entrance Stairs-Rails: Right Home Layout: One level     Bathroom Shower/Tub: Chief Strategy OfficerTub/shower unit   Bathroom Toilet: Standard     Home Equipment: Environmental consultantWalker - 2 wheels;Cane - single point;Bedside commode;Tub bench          Prior Functioning/Environment Level of Independence: Independent        Comments: pt is the postmaster in [place]        OT Problem List: Impaired balance (sitting and/or standing);Decreased knowledge of use of DME or AE;Pain      OT Treatment/Interventions:      OT Goals(Current goals can be found in the care plan section) Acute Rehab OT Goals Patient Stated Goal: to feel better  OT Frequency:     Barriers to D/C:            Co-evaluation              AM-PAC OT "6 Clicks" Daily Activity     Outcome Measure Help from another person eating meals?: None Help from another person taking care of personal grooming?: A Little Help from another person toileting, which includes using toliet, bedpan, or urinal?: A Little Help from another person bathing (including washing, rinsing, drying)?: A Little Help from another person to put on and taking off regular upper body clothing?: None Help from another person to put on and taking off regular lower body clothing?: A Little 6 Click Score: 20   End of Session    Activity Tolerance: Patient tolerated treatment well Patient left: in bed;with call bell/phone within reach  OT Visit Diagnosis: Pain;Other abnormalities of gait and mobility (R26.89);Unsteadiness on feet (R26.81)                Time: [id number] OT Time Calculation (min): 29 min Charges:  OT General Charges $OT Visit: 1 Visit OT Evaluation $OT Eval Low Complexity: 1 Low OT Treatments $Self Care/Home Management : 8-22 mins  Martie RoundJulie Riku Buttery, OTR/L Acute Rehabilitation Services Pager: [phone] Office:  [phone]  Evern BioMayberry, Idus Rathke Lynn 12/23/2018, 1:52 PM

## 2018-12-23 NOTE — Progress Notes (Signed)
  Subjective: Pt is a 53 y.o. Female who presents to the clinic s/p R TKA on 12/22/18.  She is POD1.  Pt complains of pain in her R knee but states her pain is not uncontrollable.  She has not felt comfortable enough to ambulate yet, as of early this morning.  She denies any numbness/tingling/weakness.     Objective: Vital signs in last 24 hours: Temp:  [97 F (36.1 C)-98.5 F (36.9 C)] 98.4 F (36.9 C) (07/15 0845) Pulse Rate:  [68-77] 69 (07/15 0845) Resp:  [10-19] 16 (07/15 0845) BP: (97-111)/(42-76) 100/60 (07/15 0845) SpO2:  [98 %-100 %] 99 % (07/15 0845)  Intake/Output from previous day: 07/14 0701 - 07/15 0700 In: 2288.2 [P.O.:480; I.V.:1808.2] Out: 1150 [Urine:1050; Blood:100] Intake/Output this shift: Total I/O In: 240 [P.O.:240] Out: -   Exam:  No gross blood or drainage visible on the dressing Able to dorsiflex and plantar flex R foot 2+ DP pulse on R foot Sensation intact and normal in the R foot  Labs: No results for input(s): HGB in the last 72 hours. No results for input(s): WBC, RBC, HCT, PLT in the last 72 hours. No results for input(s): NA, K, CL, CO2, BUN, CREATININE, GLUCOSE, CALCIUM in the last 72 hours. No results for input(s): LABPT, INR in the last 72 hours.  Assessment/Plan: Pt is POD1 s/p R TKA. Dressing removed today.   -WBAT with a walker  -Shower is okay, dressing is waterproof.  Cautioned patient against soaking her dressing in a bath/pool/body of water.    -Continue CPM machine 3 times per day, 1 hour each time. Increase the degrees with each passing day  -Use the blue cradle boot to work on extension  -Pt will likely be discharged home tomorrow   Donella Stade 12/23/2018, 11:54 AM

## 2018-12-23 NOTE — Progress Notes (Signed)
Physical Therapy Treatment Patient Details Name: Beverly Salazar MRN: [id number] DOB: 10/22/1965 Today's Date: 12/23/2018    History of Present Illness Pt is a 53 y/o female s/p R TKA. PMH includes anxiety, HTN, and depression.     PT Comments    Pt was able to progress OOB today with min guard for safety. Ambulation distance currently limited by pain. She completed supine HEP training. Will continue to follow to progress gait and complete seated HEP.   Follow Up Recommendations  Follow surgeon's recommendation for DC plan and follow-up therapies;Supervision for mobility/OOB     Equipment Recommendations  None recommended by PT    Recommendations for Other Services       Precautions / Restrictions Precautions Precautions: Knee Precaution Booklet Issued: Yes (comment) Precaution Comments: Pt in bed with knees bent. Reviewed knee precautions. Restrictions Weight Bearing Restrictions: Yes RLE Weight Bearing: Weight bearing as tolerated    Mobility  Bed Mobility Overal bed mobility: Needs Assistance Bed Mobility: Supine to Sit     Supine to sit: Min guard     General bed mobility comments: cues for technique with LE management. No physical assist needed.  Transfers Overall transfer level: Needs assistance Equipment used: Rolling walker (2 wheeled) Transfers: Sit to/from Stand Sit to Stand: Min guard         General transfer comment: min guard for safety and VC for hand placement using RW. No physical assist required.  Ambulation/Gait Ambulation/Gait assistance: Min guard Gait Distance (Feet): 40 Feet Assistive device: Rolling walker (2 wheeled) Gait Pattern/deviations: Step-to pattern;Decreased step length - left;Decreased stance time - right;Antalgic     General Gait Details: Pt with antalgic gait and heavy reliance on UE support for ambulation. No overt LOB with RW for support. Distance limited secondary to pain.    Stairs             Wheelchair  Mobility    Modified Rankin (Stroke Patients Only)       Balance Overall balance assessment: Needs assistance Sitting-balance support: No upper extremity supported;Feet supported Sitting balance-Leahy Scale: Good Sitting balance - Comments: able to don socks in seated position   Standing balance support: Bilateral upper extremity supported Standing balance-Leahy Scale: Poor Standing balance comment: reliant on UE support                            Cognition Arousal/Alertness: Awake/alert Behavior During Therapy: WFL for tasks assessed/performed Overall Cognitive Status: Within Functional Limits for tasks assessed                                        Exercises Total Joint Exercises Ankle Circles/Pumps: AROM;Both;10 reps;Supine Quad Sets: AROM;Right;10 reps;Supine Towel Squeeze: AROM;Right;10 reps;Supine Short Arc Quad: AROM;Right;10 reps;Supine Heel Slides: AROM;Right;10 reps;Supine Hip ABduction/ADduction: AROM;Right;10 reps;Supine Straight Leg Raises: AROM;Right;10 reps;Supine    General Comments        Pertinent Vitals/Pain Pain Assessment: Faces Faces Pain Scale: Hurts even more Pain Location: R knee with movement Pain Descriptors / Indicators: Aching;Operative site guarding Pain Intervention(s): Monitored during session;Limited activity within patient's tolerance;Repositioned    Home Living                      Prior Function            PT Goals (current goals can now be found  in the care plan section) Acute Rehab PT Goals Patient Stated Goal: to feel better PT Goal Formulation: With patient Time For Goal Achievement: 01/05/19 Potential to Achieve Goals: Good Progress towards PT goals: Progressing toward goals    Frequency    7X/week      PT Plan Current plan remains appropriate    Co-evaluation              AM-PAC PT "6 Clicks" Mobility   Outcome Measure  Help needed turning from your back to  your side while in a flat bed without using bedrails?: A Little Help needed moving from lying on your back to sitting on the side of a flat bed without using bedrails?: A Little Help needed moving to and from a bed to a chair (including a wheelchair)?: A Little Help needed standing up from a chair using your arms (e.g., wheelchair or bedside chair)?: A Little Help needed to walk in hospital room?: A Little Help needed climbing 3-5 steps with a railing? : A Lot 6 Click Score: 17    End of Session Equipment Utilized During Treatment: Gait belt Activity Tolerance: Patient tolerated treatment well;Patient limited by pain(dizziness, nausea ) Patient left: with call bell/phone within reach;in chair Nurse Communication: Mobility status PT Visit Diagnosis: Other abnormalities of gait and mobility (R26.89);Pain;Difficulty in walking, not elsewhere classified (R26.2) Pain - Right/Left: Right Pain - part of body: Knee     Time: [id number] PT Time Calculation (min) (ACUTE ONLY): 32 min  Charges:  $Gait Training: 8-22 mins $Therapeutic Exercise: 8-22 mins                     Beverly LocksHannah Keirstin Salazar, [place]PTA Pager [phone] Acute Rehab    Sheral ApleyHannah E Augustina Salazar 12/23/2018, 12:44 PM

## 2018-12-23 NOTE — Anesthesia Postprocedure Evaluation (Signed)
Anesthesia Post Note  Patient: Beverly Salazar  Procedure(s) Performed: RIGHT TOTAL KNEE ARTHROPLASTY (Right Knee)     Patient location during evaluation: PACU Anesthesia Type: Regional and General Level of consciousness: awake and alert Pain management: pain level controlled Vital Signs Assessment: post-procedure vital signs reviewed and stable Respiratory status: spontaneous breathing, nonlabored ventilation, respiratory function stable and patient connected to nasal cannula oxygen Cardiovascular status: blood pressure returned to baseline and stable Postop Assessment: no apparent nausea or vomiting Anesthetic complications: no    Last Vitals:  Vitals:   12/23/18 0443 12/23/18 0845  BP: 103/68 100/60  Pulse: 69 69  Resp: 16 16  Temp: 36.9 C 36.9 C  SpO2: 98% 99%    Last Pain:  Vitals:   12/23/18 0845  TempSrc: Oral  PainSc:                  [place] S

## 2018-12-24 DIAGNOSIS — M1711 Unilateral primary osteoarthritis, right knee: Secondary | ICD-10-CM | POA: Diagnosis not present

## 2018-12-24 MED ORDER — METHOCARBAMOL 500 MG PO TABS: 500.0000 mg | ORAL_TABLET | Freq: Three times a day (TID) | ORAL | 0 refills | Status: DC | PRN

## 2018-12-24 MED ORDER — OXYCODONE HCL 5 MG PO TABS: 5.0000 mg | ORAL_TABLET | ORAL | 0 refills | Status: DC | PRN

## 2018-12-24 MED ORDER — ASPIRIN 81 MG PO CHEW: 81.0000 mg | CHEWABLE_TABLET | Freq: Two times a day (BID) | ORAL | 0 refills | Status: DC

## 2018-12-24 NOTE — TOC Transition Note (Signed)
Transition of Care [facility]) - CM/SW Discharge Note   Patient Details  Name: Beverly Salazar MRN: [id number] Date of Birth: Jul 03, 1965  [facility] of [facility]) CM/SW Contact:  Ninfa Meeker, RN Phone Number: [phone] (working remotely) 12/24/2018, 11:46 AM   Clinical Narrative:  53 yr old female s/p right total knee arthroplasty. Case manager spoke with patient via telephone to discuss discharge plan. 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 CM contacted her case manager, Elizebeth Brooking with [facility], [phone] and was asked to contact [facility] to arrange for Home Health therapy. Case manager called referral to [facility] , [phone] (this is the number for [employer] Worker's comp.) Patient's referral number for [facility] is [id number]. Case manager faxed demographics, orders, PT eval, OP note to [facility]: [phone]. Patient will have support of husband and family at discharge. Agency that is assigned by [facility] will contact patient to discuss start date for therapy.        Final next level of care: [place] Barriers to Discharge: No Barriers Identified   Patient Goals and CMS Choice     Choice offered to / list presented to : NA(Home Health to be arranged by [facility] for [employer] workers)  Discharge Placement                       Discharge Plan and Services In-house Referral: NA Discharge Planning Services: CM Consult Post Acute Care Choice: NA          DME Arranged: 3-N-1, Walker rolling, CPM DME Agency: (Patient is worker's comp. HH being arranged by [facility]) Date DME Agency Contacted: 12/24/18 Time DME Agency Contacted: 1751 Representative spoke with at DME Agency: (Spoke with representative at [facility]) [place] Arranged: PT          Social Determinants of Health (SDOH) Interventions     Readmission Risk Interventions No flowsheet data found.

## 2018-12-24 NOTE — Progress Notes (Signed)
Physical Therapy Treatment Patient Details Name: Beverly Salazar MRN: [id number] DOB: [birth year] Today's Date: 12/24/2018    History of Present Illness Pt is a 53 y/o female s/p R TKA. PMH includes anxiety, HTN, and depression.     PT Comments    Patient received in bed, requesting to use bathroom prior to ambulation. Patient assisted from supine to sit, needs assist with R LE on/off bed. Patient transfers with supervision. She is able to ambulate 150 feet with RW and supervision.  Patient is limited by fatigue and pain this session. Reports 8/10 pain at end of session, but is able to tolerate LE strengthening and ROM exercises. Patient will continue to benefit from skilled PT to address her weakness, difficulty walking and decreased activity tolerance     Follow Up Recommendations  Follow surgeon's recommendation for DC plan and follow-up therapies;Supervision for mobility/OOB     Equipment Recommendations  None recommended by PT    Recommendations for Other Services       Precautions / Restrictions Precautions Precautions: Fall;Knee Restrictions Weight Bearing Restrictions: No RLE Weight Bearing: Weight bearing as tolerated    Mobility  Bed Mobility Overal bed mobility: Needs Assistance Bed Mobility: Supine to Sit;Sit to Supine     Supine to sit: Min assist Sit to supine: Min assist   General bed mobility comments: requires assistance with bringing R LE on and off bed  Transfers Overall transfer level: Modified independent Equipment used: Rolling walker (2 wheeled) Transfers: Sit to/from Stand Sit to Stand: Supervision;From elevated surface            Ambulation/Gait Ambulation/Gait assistance: Modified independent (Device/Increase time);Supervision Gait Distance (Feet): 150 Feet Assistive device: Rolling walker (2 wheeled) Gait Pattern/deviations: Step-to pattern;Antalgic;Decreased stride length;Decreased step length - left;Decreased stance time -  right Gait velocity: decreased   General Gait Details: fatigues with ambulation, but requires no physical assist, safe.   Stairs             Wheelchair Mobility    Modified Rankin (Stroke Patients Only)       Balance Overall balance assessment: Modified Independent Sitting-balance support: Feet supported Sitting balance-Leahy Scale: Normal     Standing balance support: Bilateral upper extremity supported Standing balance-Leahy Scale: Good Standing balance comment: able to perform hand hygiene w/o UE support                            Cognition Arousal/Alertness: Awake/alert Behavior During Therapy: WFL for tasks assessed/performed Overall Cognitive Status: Within Functional Limits for tasks assessed                                        Exercises Total Joint Exercises Ankle Circles/Pumps: AROM;Both;10 reps Heel Slides: AAROM;10 reps;Right Hip ABduction/ADduction: AAROM;10 reps;Right Long Arc Quad: AAROM;Right;10 reps Goniometric ROM: 0-90    General Comments        Pertinent Vitals/Pain Pain Assessment: 0-10 Pain Score: 8  Pain Location: R knee with movement Pain Descriptors / Indicators: Throbbing Pain Intervention(s): Limited activity within patient's tolerance;Monitored during session;Patient requesting pain meds-RN notified;RN gave pain meds during session;Repositioned    Home Living                      Prior Function            PT Goals (current goals can now  be found in the care plan section) Acute Rehab PT Goals Patient Stated Goal: to feel better PT Goal Formulation: With patient Time For Goal Achievement: 01/05/19 Potential to Achieve Goals: Good Progress towards PT goals: Progressing toward goals    Frequency    7X/week      PT Plan Current plan remains appropriate    Co-evaluation              AM-PAC PT "6 Clicks" Mobility   Outcome Measure  Help needed turning from your back  to your side while in a flat bed without using bedrails?: A Little Help needed moving from lying on your back to sitting on the side of a flat bed without using bedrails?: A Little Help needed moving to and from a bed to a chair (including a wheelchair)?: A Little Help needed standing up from a chair using your arms (e.g., wheelchair or bedside chair)?: A Little Help needed to walk in hospital room?: A Little Help needed climbing 3-5 steps with a railing? : A Little 6 Click Score: 18    End of Session Equipment Utilized During Treatment: Gait belt Activity Tolerance: Patient tolerated treatment well;Patient limited by pain Patient left: in bed;with bed alarm set;with call bell/phone within reach Nurse Communication: Mobility status;Patient requests pain meds PT Visit Diagnosis: Other abnormalities of gait and mobility (R26.89);Pain;Difficulty in walking, not elsewhere classified (R26.2);Muscle weakness (generalized) (M62.81) Pain - Right/Left: Right Pain - part of body: Knee     Time: 1275-1700 PT Time Calculation (min) (ACUTE ONLY): 29 min  Charges:  $Gait Training: 8-22 mins $Therapeutic Exercise: 8-22 mins                     Anisah Kuck, PT, GCS 12/24/18,10:41 AM

## 2018-12-24 NOTE — Progress Notes (Signed)
  Subjective: Beverly Salazar is a 53 y.o. female s/p R TKA.  They are POD2.  Pt's pain is improving.  Pt denies numbness/tingling/weakness.  PT went well yesterday and she was able to climb stairs; she has one stair in her home.  Pt has ambulated without difficulty to the bathroom following PT yesterday.     Objective: Vital signs in last 24 hours: Temp:  [98.4 F (36.9 C)-100.6 F (38.1 C)] 98.5 F (36.9 C) (07/16 0514) Pulse Rate:  [69-96] 74 (07/16 0514) Resp:  [16] 16 (07/16 0514) BP: (100-122)/(60-74) 122/74 (07/16 0514) SpO2:  [98 %-100 %] 98 % (07/16 0514)  Intake/Output from previous day: 07/15 0701 - 07/16 0700 In: 1100 [P.O.:1100] Out: 900 [Urine:900] Intake/Output this shift: No intake/output data recorded.  Exam:  No gross blood or drainage overlying the dressing 2+ DP pulse Sensation intact distally in the R foot Able to dorsiflex and plantarflex the R foot   Labs: No results for input(s): HGB in the last 72 hours. No results for input(s): WBC, RBC, HCT, PLT in the last 72 hours. No results for input(s): NA, K, CL, CO2, BUN, CREATININE, GLUCOSE, CALCIUM in the last 72 hours. No results for input(s): LABPT, INR in the last 72 hours.  Assessment/Plan: Pt is POD2 s/p R TKA.    -Plan to discharge to home today   -WBAT with a walker  -Okay to shower, dressing is waterproof.  Cautioned patient against soaking dressing in bath/pool/body of water  -Encouraged the use of the blue cradle boot to work on extension.  Cautioned patient against using a pillow under their knee.  -Use the CPM machine at least 3 times per day for one hour each time, increasing the degrees daily.     Beverly Salazar 12/24/2018, 7:57 AM

## 2018-12-25 NOTE — Discharge Summary (Signed)
Physician Discharge Summary      Patient ID: Beverly Salazar MRN: [id number] DOB/AGE: 1965/11/07 53 y.o.  Admit date: 12/22/2018 Discharge date: 12/25/2018  Admission Diagnoses:  Active Problems:   Arthritis of knee   Discharge Diagnoses:  Same  Surgeries: Procedure(s): RIGHT TOTAL KNEE ARTHROPLASTY on 12/22/2018   Consultants:   Discharged Condition: Stable  Hospital Course: Beverly Salazar is an 53 y.o. female who was admitted 12/22/2018 with a chief complaint of R knee pain , and found to have a diagnosis of R knee arthritis.  They were brought to the operating room on 12/22/2018 and underwent the above named procedures.  There were no complications during the procedure and patient awoke from anesthesia without incident.  On POD0, patient had not felt comfortable enough to ambulate but denied any symptom other than postoperative pain.  On POD1, patient was able to complete a HEP and stair training with PT.  Her pain improved over the course of her stay in the hospital. On POD2, patient stated that she was comfortable ambulating and wished to go home.  No numbness/tingling/weakness reported by patient throughout her hospitalization following the end of her anesthetic block.  Pt was discharged home on POD2.    Antibiotics given:  Anti-infectives (From admission, onward)   Start     Dose/Rate Route Frequency Ordered Stop   12/22/18 1330  ceFAZolin (ANCEF) IVPB 2g/100 mL premix     2 g 200 mL/hr over 30 Minutes Intravenous Every 6 hours 12/22/18 1321 12/22/18 1902   12/22/18 0645  ceFAZolin (ANCEF) IVPB 2g/100 mL premix     2 g 200 mL/hr over 30 Minutes Intravenous On call to O.R. 12/22/18 0636 12/22/18 0817   12/22/18 0552  ceFAZolin (ANCEF) 2-4 GM/100ML-% IVPB    Note to Pharmacy: Marga Melnick   : cabinet override      12/22/18 0552 12/22/18 0747    .  Recent vital signs:  Vitals:   12/24/18 0514 12/24/18 0830  BP: 122/74 (!) 105/53  Pulse: 74 84  Resp: 16 18  Temp: 98.5  F (36.9 C) 99.2 F (37.3 C)  SpO2: 98% 100%    Recent laboratory studies:  Results for orders placed or performed during the hospital encounter of 12/22/18  Glucose, capillary  Result Value Ref Range   Glucose-Capillary 87 70 - 99 mg/dL  Glucose, capillary  Result Value Ref Range   Glucose-Capillary 107 (H) 70 - 99 mg/dL    Discharge Medications:   Allergies as of 12/24/2018   No Known Allergies     Medication List    TAKE these medications   aspirin 81 MG chewable tablet Chew 1 tablet (81 mg total) by mouth 2 (two) times daily.   buPROPion 150 MG 24 hr tablet Commonly known as: WELLBUTRIN XL Take 150 mg by mouth daily.   cholecalciferol 25 MCG (1000 UT) tablet Commonly known as: VITAMIN D3 Take 1,000 Units by mouth daily.   escitalopram 20 MG tablet Commonly known as: LEXAPRO Take 20 mg by mouth daily.   furosemide 20 MG tablet Commonly known as: LASIX Take 20 mg by mouth daily.   hydrochlorothiazide 25 MG tablet Commonly known as: HYDRODIURIL Take 25 mg by mouth daily.   metFORMIN 500 MG 24 hr tablet Commonly known as: GLUCOPHAGE-XR Take 500 mg by mouth daily with breakfast.   methocarbamol 500 MG tablet Commonly known as: ROBAXIN Take 1 tablet (500 mg total) by mouth every 8 (eight) hours as needed for muscle spasms.  multivitamin with minerals Tabs tablet Take 1 tablet by mouth 3 (three) times a week.   oxyCODONE 5 MG immediate release tablet Commonly known as: Oxy IR/ROXICODONE Take 1 tablet (5 mg total) by mouth every 4 (four) hours as needed for moderate pain (pain score 4-6).   phentermine 37.5 MG capsule Take 37.5 mg by mouth daily.   potassium chloride 10 MEQ tablet Commonly known as: K-DUR Take 10 mEq by mouth 2 (two) times daily.   potassium chloride 10 MEQ tablet Commonly known as: K-DUR 1 po bid x 5 days   traZODone 50 MG tablet Commonly known as: DESYREL Take 50-100 mg by mouth at bedtime.       Diagnostic Studies: No  results found.  Disposition:   Discharge Instructions    Call MD / Call 911   Complete by: As directed    If you experience chest pain or shortness of breath, CALL 911 and be transported to the hospital emergency room.  If you develope a fever above 101 F, pus (white drainage) or increased drainage or redness at the wound, or calf pain, call your surgeon's office.   Constipation Prevention   Complete by: As directed    Drink plenty of fluids.  Prune juice may be helpful.  You may use a stool softener, such as Colace (over the counter) 100 mg twice a day.  Use MiraLax (over the counter) for constipation as needed.   Diet - low sodium heart healthy   Complete by: As directed    Discharge instructions   Complete by: As directed    Okay to shower, dressing is waterproof.  Do not soak dressing in bath/pool/body of water. Use of the blue cradle boot to work on extension.  Do not place a pillow under your knee. Use the CPM machine at least 3 times per day for at least one hour each time, increasing the degrees daily.   Increase activity slowly as tolerated   Complete by: As directed       Follow-up Information    [facility] Follow up.   Why: You will recieve a phone call from the Home Health agency assigned by your worker's comp. Should you not hear from someone within 48 hrs. Please contact Danie BinderMichael Fryar. [phone].           Signed: Julieanne CottonCharles L Margrit Minner 12/25/2018, 12:16 PM

## 2018-12-26 ENCOUNTER — Encounter: Payer: Self-pay | Admitting: Orthopedic Surgery

## 2018-12-28 NOTE — Telephone Encounter (Signed)
Aspirin 81 mg by mouth twice a day.  Add ibuprofen 400 mg 3 times a day for 1200 mg a day total.  Let me know if you run out of oxycodone after the first week and that can be refilled.  Thanks

## 2018-12-29 NOTE — Telephone Encounter (Signed)
Normal for the postop total knee to have warmth.  Okay to refill pain meds

## 2018-12-30 ENCOUNTER — Telehealth: Payer: Self-pay

## 2018-12-30 ENCOUNTER — Other Ambulatory Visit: Payer: Self-pay | Admitting: Surgical

## 2018-12-30 ENCOUNTER — Encounter: Payer: Self-pay | Admitting: Orthopedic Surgery

## 2018-12-30 MED ORDER — OXYCODONE HCL 5 MG PO TABS: 5.0000 mg | ORAL_TABLET | Freq: Four times a day (QID) | ORAL | 0 refills | Status: DC | PRN

## 2018-12-30 NOTE — Telephone Encounter (Signed)
Can you please send in refill for oxycodone for patient per Dr Marlou Sa? Thanks

## 2018-12-30 NOTE — Telephone Encounter (Signed)
Sent!

## 2018-12-31 NOTE — Telephone Encounter (Signed)
Incentive spirometer only is really helpful for the first 5 days after surgery.  She does not need one now.  Thanks

## 2019-01-01 ENCOUNTER — Encounter: Payer: Self-pay | Admitting: Orthopedic Surgery

## 2019-01-06 ENCOUNTER — Encounter: Payer: Self-pay | Admitting: Orthopedic Surgery

## 2019-01-06 ENCOUNTER — Ambulatory Visit (INDEPENDENT_AMBULATORY_CARE_PROVIDER_SITE_OTHER): Admitting: Orthopedic Surgery

## 2019-01-06 ENCOUNTER — Other Ambulatory Visit: Payer: Self-pay

## 2019-01-06 ENCOUNTER — Ambulatory Visit (INDEPENDENT_AMBULATORY_CARE_PROVIDER_SITE_OTHER)

## 2019-01-06 DIAGNOSIS — M1711 Unilateral primary osteoarthritis, right knee: Secondary | ICD-10-CM

## 2019-01-06 DIAGNOSIS — Z96651 Presence of right artificial knee joint: Secondary | ICD-10-CM

## 2019-01-06 DIAGNOSIS — M1710 Unilateral primary osteoarthritis, unspecified knee: Secondary | ICD-10-CM

## 2019-01-06 MED ORDER — OXYCODONE HCL 5 MG PO TABS: 5.0000 mg | ORAL_TABLET | Freq: Three times a day (TID) | ORAL | 0 refills | Status: DC | PRN

## 2019-01-06 NOTE — Progress Notes (Signed)
   Post-Op Visit Note   Patient: Beverly Salazar           Date of Birth: 1965/08/15           MRN: [id number] Visit Date: 01/06/2019 PCP: Burman Freestone, MD   Assessment & Plan:  Chief Complaint:  Chief Complaint  Patient presents with  . Right Knee - Routine Post Op   Visit Diagnoses:  1. Arthritis of knee   2. S/P total knee arthroplasty, right     Plan: Beverly Salazar is now about 2 weeks out right total knee replacement.  She has been doing well.  Using a walker.  On exam she has range of motion 5-85.  Nighttime pain is worse.  I will refill her oxycodone.  No calf tenderness today.  Continue with aspirin for DVT prophylaxis.  Start outpatient physical therapy next week at [facility].  3 times a week for 4 weeks for range of motion and strengthening.  Radiographs look good today.  Come back in 4 weeks for clinical recheck.  Follow-Up Instructions: Return in about 4 weeks (around 02/03/2019).   Orders:  Orders Placed This Encounter  Procedures  . XR Knee 1-2 Views Right  . Ambulatory referral to Physical Therapy   Meds ordered this encounter  Medications  . oxyCODONE (OXY IR/ROXICODONE) 5 MG immediate release tablet    Sig: Take 1 tablet (5 mg total) by mouth every 8 (eight) hours as needed for moderate pain (pain score 4-6).    Dispense:  30 tablet    Refill:  0    Imaging: Xr Knee 1-2 Views Right  Result Date: 01/06/2019 AP lateral right knee reviewed.  Total knee prosthesis in good position alignment with no complicating features.   PMFS History: Patient Active Problem List   Diagnosis Date Noted  . Arthritis of knee 12/22/2018  . Unilateral primary osteoarthritis, right knee 02/05/2017   Past Medical History:  Diagnosis Date  . Anemia    history of  . Anxiety   . Arthritis   . Depression   . Hypertension   . Pre-diabetes     History reviewed. No pertinent family history.  Past Surgical History:  Procedure Laterality Date  . ABDOMINAL  HYSTERECTOMY    . KNEE ARTHROSCOPY     right 2012, left 2018  . TOTAL KNEE ARTHROPLASTY Right 12/22/2018   Procedure: RIGHT TOTAL KNEE ARTHROPLASTY;  Surgeon: Meredith Pel, MD;  Location: [facility];  Service: Orthopedics;  Laterality: Right;  . WISDOM TOOTH EXTRACTION     Social History   Occupational History  . Not on file  Tobacco Use  . Smoking status: Never Smoker  . Smokeless tobacco: Never Used  Substance and Sexual Activity  . Alcohol use: No  . Drug use: No  . Sexual activity: Yes    Birth control/protection: Surgical

## 2019-01-11 ENCOUNTER — Encounter: Payer: Self-pay | Admitting: Orthopedic Surgery

## 2019-01-12 ENCOUNTER — Ambulatory Visit: Admitting: Physical Therapy

## 2019-01-19 ENCOUNTER — Encounter: Payer: Self-pay | Admitting: Orthopedic Surgery

## 2019-01-25 ENCOUNTER — Encounter: Payer: Self-pay | Admitting: Orthopedic Surgery

## 2019-01-26 NOTE — Telephone Encounter (Signed)
Yes lets see her 4 w after pt thx

## 2019-02-05 ENCOUNTER — Ambulatory Visit: Payer: Federal, State, Local not specified - PPO | Admitting: Orthopedic Surgery

## 2019-02-08 ENCOUNTER — Encounter (INDEPENDENT_AMBULATORY_CARE_PROVIDER_SITE_OTHER): Payer: Self-pay | Admitting: Physical Medicine and Rehabilitation

## 2019-02-12 ENCOUNTER — Encounter: Payer: Self-pay | Admitting: Orthopedic Surgery

## 2019-02-16 ENCOUNTER — Encounter: Payer: Self-pay | Admitting: Orthopedic Surgery

## 2019-02-16 NOTE — Telephone Encounter (Signed)
Try norco 1 oneo o q 8 # 30

## 2019-02-17 ENCOUNTER — Other Ambulatory Visit: Payer: Self-pay | Admitting: Surgical

## 2019-02-17 MED ORDER — HYDROCODONE-ACETAMINOPHEN 5-325 MG PO TABS: 1.0000 | ORAL_TABLET | Freq: Three times a day (TID) | ORAL | 0 refills | Status: DC

## 2019-02-21 IMAGING — CR DG KNEE COMPLETE 4+V*R*
4 series · 4 of 4 positions shown · non-contrast
Comparison: Plain film of the bilateral knees dated 05/29/2016.

CLINICAL DATA: Pt with right knee pain and swelling x 4 days, pain
when bearing weight, NKI, hx meniscal tear repair in past

EXAM:
RIGHT KNEE - COMPLETE 4+ VIEW

[t knee ap right]
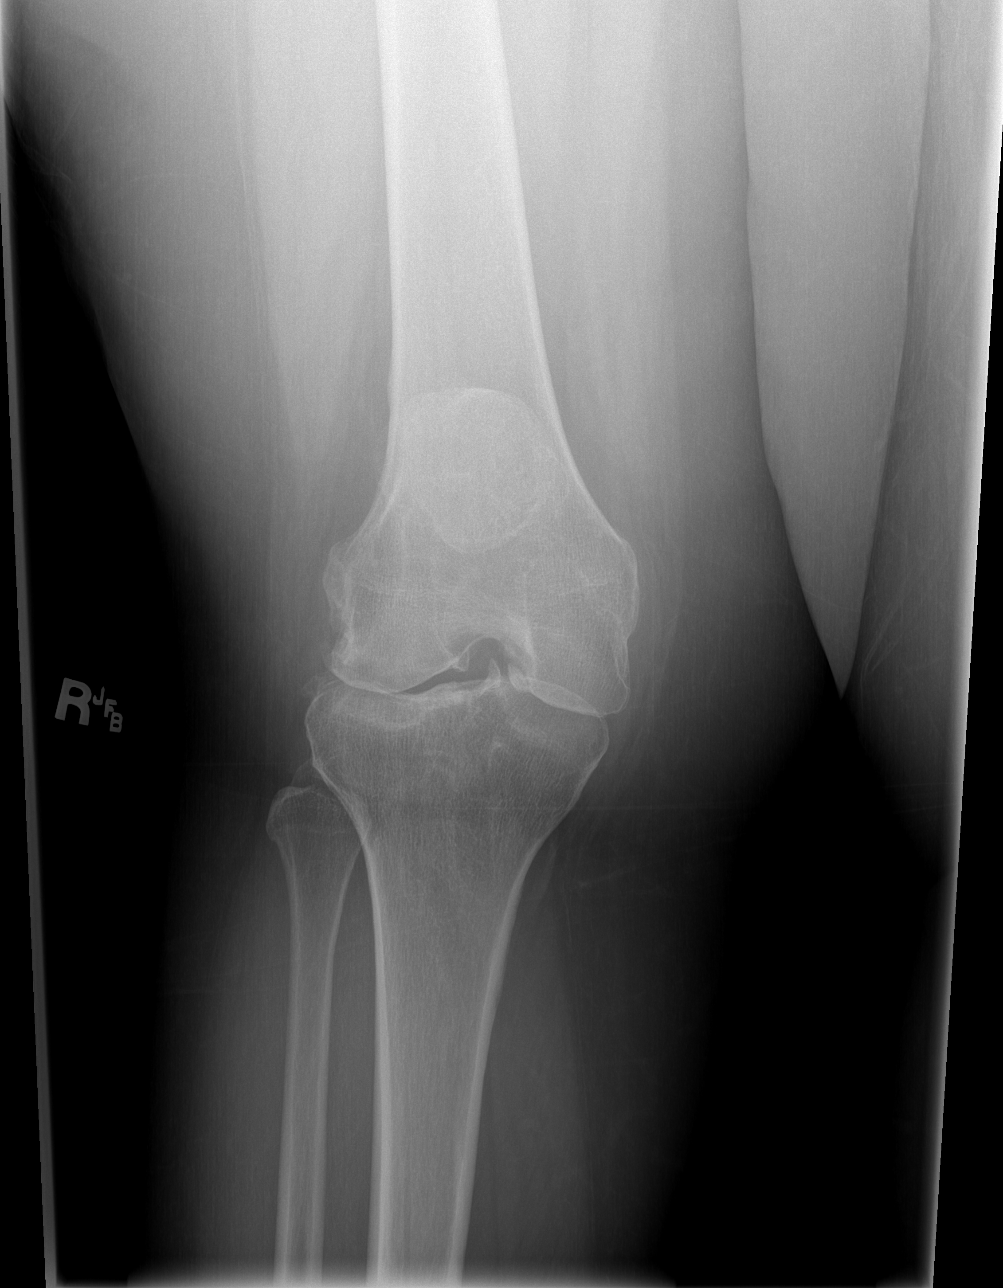

[t knee oblique right (1 of 2)]
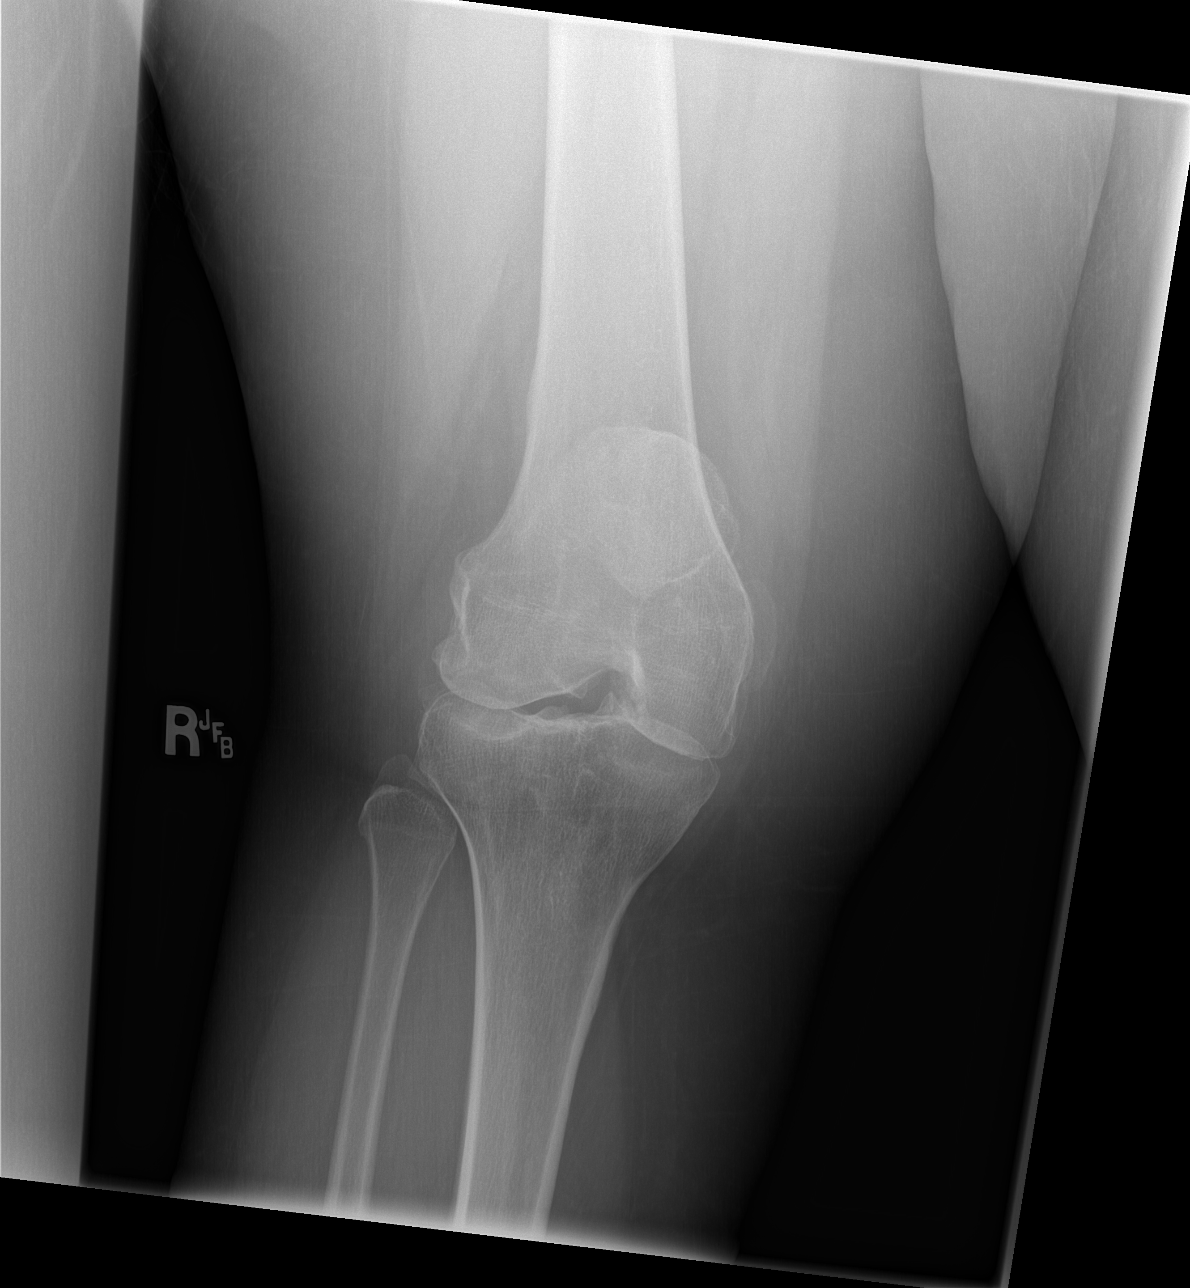

[t knee oblique right (2 of 2)]
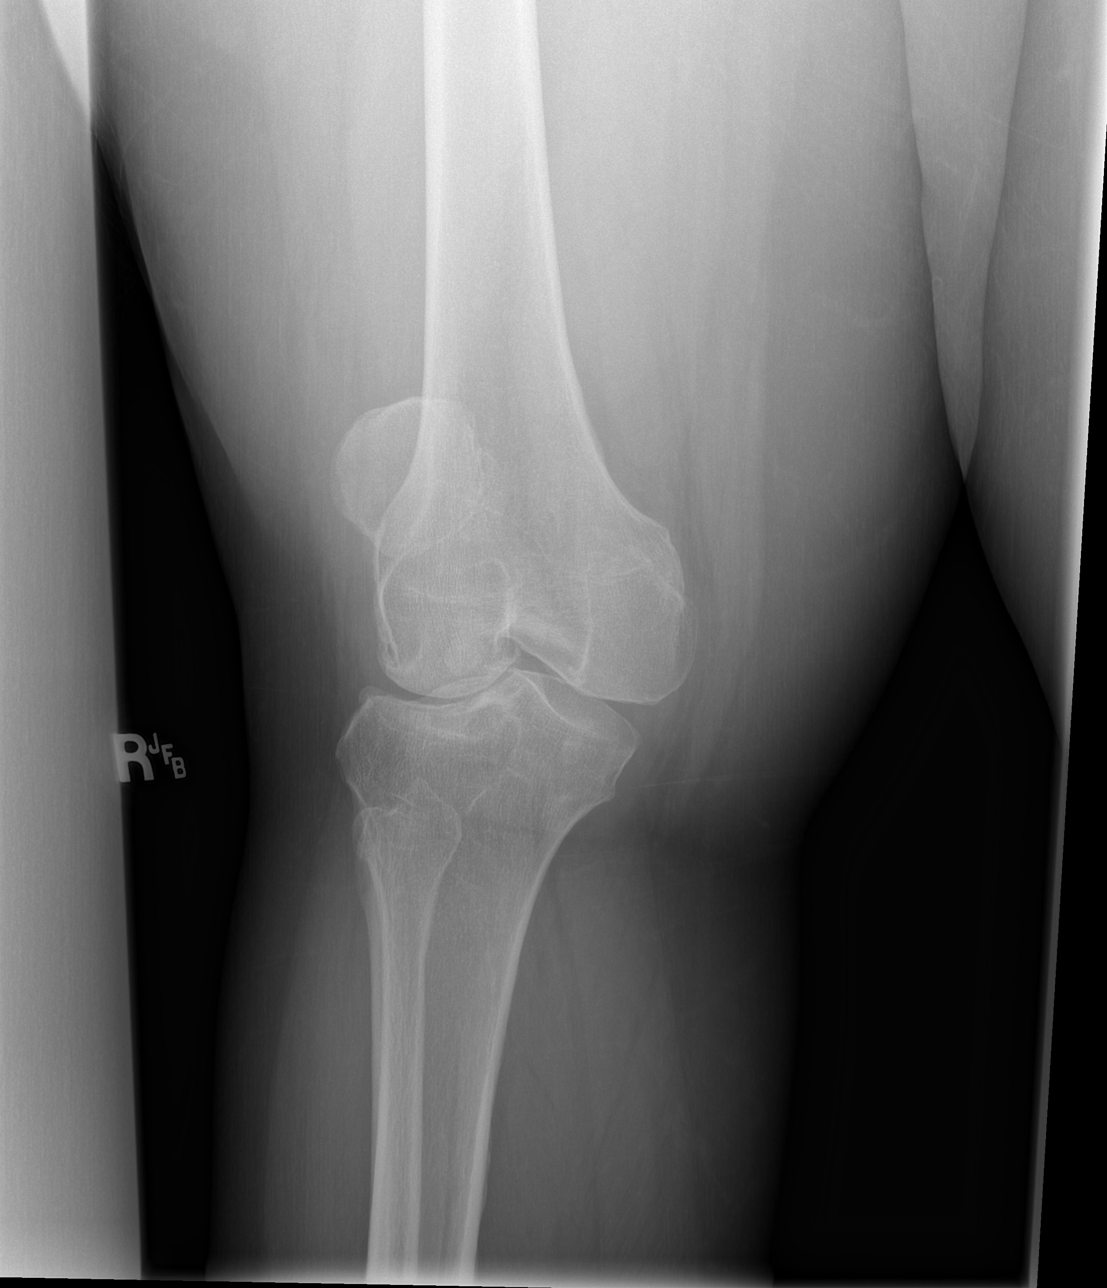

[t knee lat right]
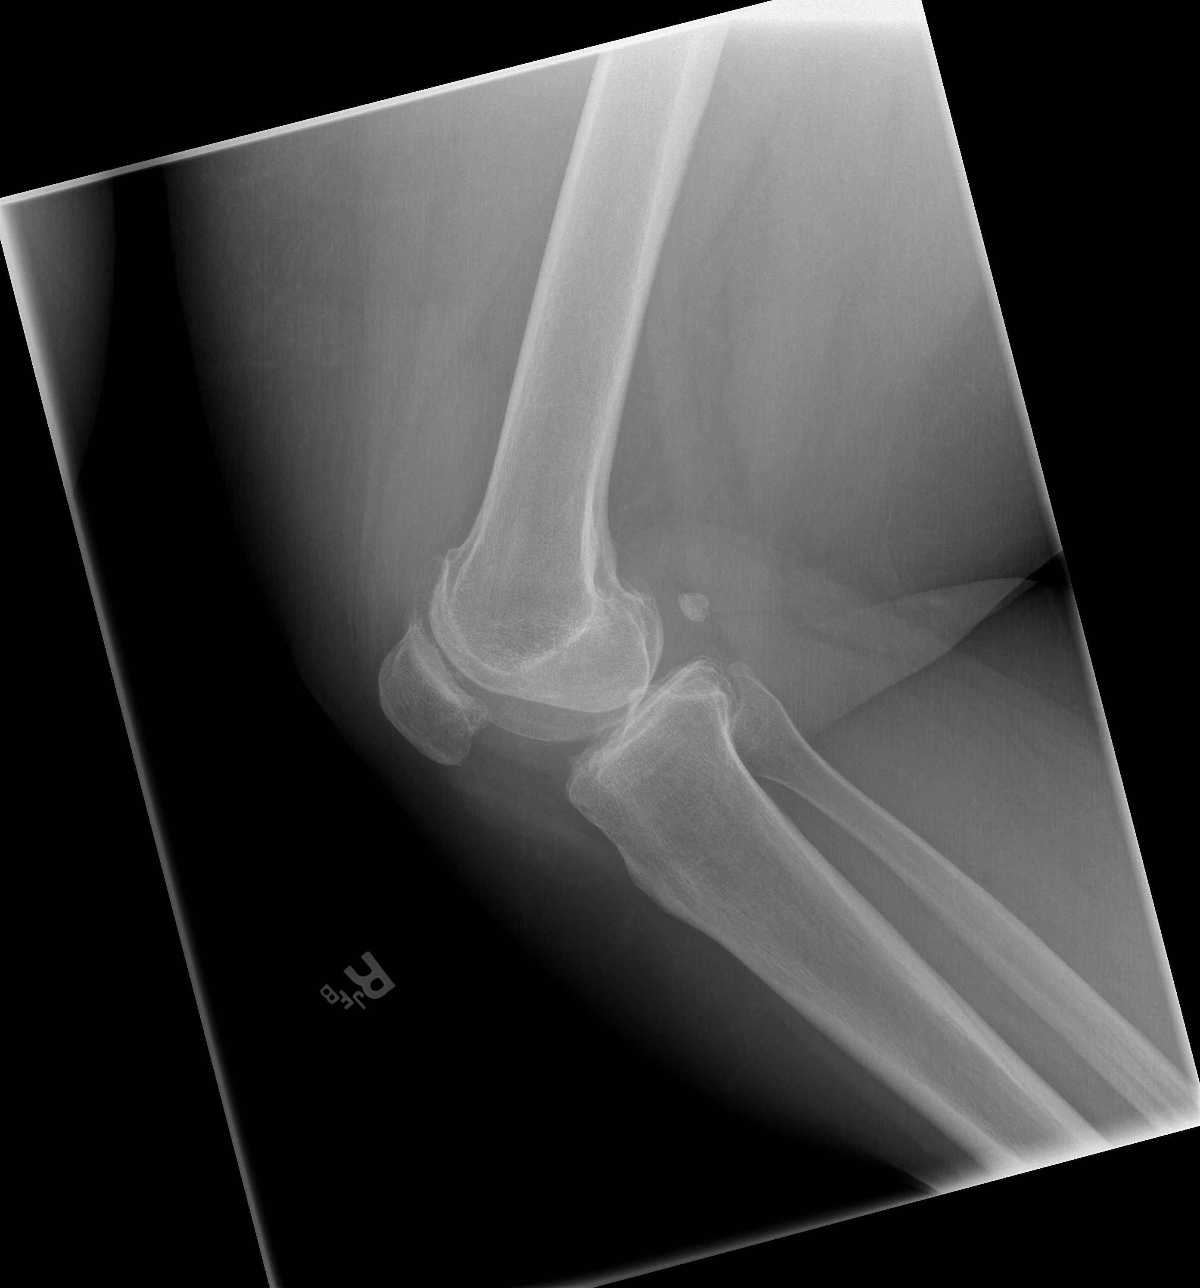

[4 of 4 positions shown; findings below may reference images not displayed]

FINDINGS: Osseous alignment is normal. Bone mineralization is normal. No
fracture line or displaced fracture fragment seen. No focal osseous
lesion.

There is mild tricompartmental degenerative spurring. No evidence of
advanced degenerative joint disease. No appreciable joint effusion
and adjacent soft tissues are unremarkable.
IMPRESSION: 1. No acute findings.
2. Mild tricompartmental DJD.

## 2019-02-24 ENCOUNTER — Encounter: Payer: Self-pay | Admitting: Orthopedic Surgery

## 2019-02-24 ENCOUNTER — Ambulatory Visit (INDEPENDENT_AMBULATORY_CARE_PROVIDER_SITE_OTHER): Admitting: Orthopedic Surgery

## 2019-02-24 DIAGNOSIS — Z96651 Presence of right artificial knee joint: Secondary | ICD-10-CM

## 2019-02-24 NOTE — Progress Notes (Signed)
   Post-Op Visit Note   Patient: Beverly Salazar           Date of Birth: 22-Aug-1965           MRN: [id number] Visit Date: 02/24/2019 PCP: Burman Freestone, MD   Assessment & Plan:  Chief Complaint:  Chief Complaint  Patient presents with  . Follow-up   Visit Diagnoses:  1. S/P total knee arthroplasty, right     Plan: Beverly Salazar is a patient who is now 8 weeks out right total knee replacement.  On exam she has excellent range of motion and strength.  Full extension with flexion to about 10 5-1 10.  No effusion.  On her to continue working with therapy for range of motion strengthening.  6 weeks return for final check.  Follow-Up Instructions: Return in about 6 weeks (around 04/07/2019).   Orders:  No orders of the defined types were placed in this encounter.  No orders of the defined types were placed in this encounter.   Imaging: No results found.  PMFS History: Patient Active Problem List   Diagnosis Date Noted  . Arthritis of knee 12/22/2018  . Unilateral primary osteoarthritis, right knee 02/05/2017   Past Medical History:  Diagnosis Date  . Anemia    history of  . Anxiety   . Arthritis   . Depression   . Hypertension   . Pre-diabetes     History reviewed. No pertinent family history.  Past Surgical History:  Procedure Laterality Date  . ABDOMINAL HYSTERECTOMY    . KNEE ARTHROSCOPY     right 2012, left 2018  . TOTAL KNEE ARTHROPLASTY Right 12/22/2018   Procedure: RIGHT TOTAL KNEE ARTHROPLASTY;  Surgeon: Meredith Pel, MD;  Location: [facility];  Service: Orthopedics;  Laterality: Right;  . WISDOM TOOTH EXTRACTION     Social History   Occupational History  . Not on file  Tobacco Use  . Smoking status: Never Smoker  . Smokeless tobacco: Never Used  Substance and Sexual Activity  . Alcohol use: No  . Drug use: No  . Sexual activity: Yes    Birth control/protection: Surgical

## 2019-03-28 ENCOUNTER — Encounter: Payer: Self-pay | Admitting: Orthopedic Surgery

## 2019-03-29 NOTE — Telephone Encounter (Signed)
Not really sure what to make of this have her send a picture.  Thanks if she cannot do that then we will wait until she comes back but it does not sound like it is a major problem.

## 2019-04-01 ENCOUNTER — Telehealth: Payer: Self-pay

## 2019-04-01 DIAGNOSIS — Z96651 Presence of right artificial knee joint: Secondary | ICD-10-CM

## 2019-04-01 NOTE — Addendum Note (Signed)
Addended by: Meyer Cory on: 04/01/2019 05:09 PM   Modules accepted: Orders

## 2019-04-01 NOTE — Telephone Encounter (Signed)
New order for PT entered. Patient is W/C.  I did not fax to [place] due to no approval. Thanks.

## 2019-04-01 NOTE — Telephone Encounter (Signed)
Ok for order?  

## 2019-04-01 NOTE — Telephone Encounter (Signed)
Vanda at [facility] center in [facility] would like a new PT order for patient to continue PT.  Patient is WC.  Fax# [phone].  Cb# is [phone].  Please advise.  Thank you.

## 2019-04-07 ENCOUNTER — Ambulatory Visit: Payer: Self-pay | Admitting: Orthopedic Surgery

## 2019-04-07 NOTE — Telephone Encounter (Signed)
Faxed to [facility] [phone]

## 2019-04-21 ENCOUNTER — Ambulatory Visit (INDEPENDENT_AMBULATORY_CARE_PROVIDER_SITE_OTHER)

## 2019-04-21 ENCOUNTER — Other Ambulatory Visit: Payer: Self-pay

## 2019-04-21 ENCOUNTER — Encounter: Payer: Self-pay | Admitting: Orthopedic Surgery

## 2019-04-21 ENCOUNTER — Ambulatory Visit (INDEPENDENT_AMBULATORY_CARE_PROVIDER_SITE_OTHER): Admitting: Orthopedic Surgery

## 2019-04-21 DIAGNOSIS — Z96651 Presence of right artificial knee joint: Secondary | ICD-10-CM

## 2019-04-21 DIAGNOSIS — M1712 Unilateral primary osteoarthritis, left knee: Secondary | ICD-10-CM | POA: Diagnosis not present

## 2019-04-21 NOTE — Progress Notes (Signed)
Office Visit Note   Patient: Beverly Salazar           Date of Birth: 08/25/1965           MRN: [id number] Visit Date: 04/21/2019 Requested by: Burman Freestone, MD [address] [place],  [place] [address] PCP: Burman Freestone, MD  Subjective: Chief Complaint  Patient presents with  . Right Knee - Follow-up  . Left Knee - Pain    HPI: Beverly Salazar is a 53 y.o. female who presents to the office s/p right total knee arthroplasty on 12/22/2018.  Patient states that she is doing pretty well overall.  She is going to physical therapy.  Having her last physical therapy appointment scheduled for 05/21/2019.  Patient is already continued difficulty with descending stairs but feels like she is still making progress.  She is ambulating with a cane.  She takes tramadol for pain.  She is using vitamin E oil for scar massage.  Patient notes that her left knee is causing her significant pain and difficulty with rehab for the right knee.  Patient has a long history of left knee pain and desires left total knee arthroplasty in January 2021..                ROS:  All systems reviewed are negative as they relate to the chief complaint within the history of present illness.  Patient denies fevers or chills.  Assessment & Plan: Visit Diagnoses:  1. S/P total knee arthroplasty, right   2. Unilateral primary osteoarthritis, left knee     Plan: Patient is a 53 year old female who presents s/p right total knee arthroplasty on 12/22/2018.  Patient is doing well overall and has excellent range of motion on exam.  Her incision is healing up well.  Recommend the patient complete her sessions of physical therapy and stay the course.  Patient will follow-up in 8 weeks and we will decide on left total knee arthroplasty at that visit.  Patient agreed with plan and will follow up.  Generally she wants to make sure that the right knee is fully functional and pain free to the degree that it can be  before proceeding with left total knee replacement.  Follow-Up Instructions: No follow-ups on file.   Orders:  Orders Placed This Encounter  Procedures  . XR KNEE 3 VIEW LEFT   No orders of the defined types were placed in this encounter.     Procedures: No procedures performed   Clinical Data: No additional findings.  Objective: Vital Signs: LMP  (LMP Unknown)   Physical Exam:  Constitutional: Patient appears well-developed HEENT:  Head: Normocephalic Eyes:EOM are normal Neck: Normal range of motion Cardiovascular: Normal rate Pulmonary/chest: Effort normal Neurologic: Patient is alert Skin: Skin is warm Psychiatric: Patient has normal mood and affect  Ortho Exam:  Right Knee Exam No effusion Extensor mechanism intact 0 degrees extension 105 degrees flexion Incision healing well.  One focal area of scar tissue at the distal aspect of the incision  Left knee exam No effusion Extensor mechanism intact No TTP over quad tendon, patellar tendon, pes anserinus, patella, tibial tubercle, LCL/MCL insertions TTP over the medial joint line and lateral joint line, more so over the medial joint line. Stable to varus/valgus stresses.  Stable to anterior/posterior drawer Extension to 0 degrees Flexion > 90 degrees  Specialty Comments:  No specialty comments available.  Imaging: No results found.   PMFS History: Patient Active Problem  List   Diagnosis Date Noted  . Arthritis of knee 12/22/2018  . Unilateral primary osteoarthritis, right knee 02/05/2017   Past Medical History:  Diagnosis Date  . Anemia    history of  . Anxiety   . Arthritis   . Depression   . Hypertension   . Pre-diabetes     No family history on file.  Past Surgical History:  Procedure Laterality Date  . ABDOMINAL HYSTERECTOMY    . KNEE ARTHROSCOPY     right 2012, left 2018  . TOTAL KNEE ARTHROPLASTY Right 12/22/2018   Procedure: RIGHT TOTAL KNEE ARTHROPLASTY;  Surgeon: Cammy Copa, MD;  Location: [facility] OR;  Service: Orthopedics;  Laterality: Right;  . WISDOM TOOTH EXTRACTION     Social History   Occupational History  . Not on file  Tobacco Use  . Smoking status: Never Smoker  . Smokeless tobacco: Never Used  Substance and Sexual Activity  . Alcohol use: No  . Drug use: No  . Sexual activity: Yes    Birth control/protection: Surgical

## 2019-04-28 ENCOUNTER — Encounter: Payer: Self-pay | Admitting: Orthopedic Surgery

## 2019-05-11 ENCOUNTER — Encounter: Payer: Self-pay | Admitting: Orthopedic Surgery

## 2019-05-24 ENCOUNTER — Telehealth: Payer: Self-pay

## 2019-05-24 ENCOUNTER — Encounter: Payer: Self-pay | Admitting: Orthopedic Surgery

## 2019-05-24 NOTE — Telephone Encounter (Signed)
Mailed completed attending physicians report and the 04/21/19 office note to dept of labor

## 2019-06-01 ENCOUNTER — Encounter: Payer: Self-pay | Admitting: Orthopedic Surgery

## 2019-06-21 ENCOUNTER — Other Ambulatory Visit: Payer: Self-pay

## 2019-06-21 ENCOUNTER — Ambulatory Visit (INDEPENDENT_AMBULATORY_CARE_PROVIDER_SITE_OTHER): Admitting: Orthopedic Surgery

## 2019-06-21 ENCOUNTER — Encounter: Payer: Self-pay | Admitting: Orthopedic Surgery

## 2019-06-21 DIAGNOSIS — M1711 Unilateral primary osteoarthritis, right knee: Secondary | ICD-10-CM

## 2019-06-21 DIAGNOSIS — M1712 Unilateral primary osteoarthritis, left knee: Secondary | ICD-10-CM | POA: Diagnosis not present

## 2019-06-21 MED ORDER — TRAMADOL HCL 50 MG PO TABS: ORAL_TABLET | ORAL | 0 refills | Status: DC

## 2019-06-27 ENCOUNTER — Encounter: Payer: Self-pay | Admitting: Orthopedic Surgery

## 2019-06-27 NOTE — Progress Notes (Signed)
Office Visit Note   Patient: Beverly Salazar           Date of Birth: [birth year]           MRN: [id number] Visit Date: 06/21/2019 Requested by: Zoila Shutter, MD [address] [place],  [place] [address] PCP: Zoila Shutter, MD  Subjective: Chief Complaint  Patient presents with  . Follow-up    HPI: Beverly Salazar is a patient who underwent right total knee replacement 12/22/2018.  She is doing reasonably well with that.  She wants to get the other knee replacement scheduled.  She has not driven yet because she is afraid of having an accident.  I did tell her that I cannot really tell her when she is safe to drive or not but most people in her situation with the range of motion strength and absence of pain medicine and range of motion that she has would be driving by now.  She does report excessive itching around the right leg.  Left knee is also painful for her.              ROS: All systems reviewed are negative as they relate to the chief complaint within the history of present illness.  Patient denies  fevers or chills.   Assessment & Plan: Visit Diagnoses:  1. Unilateral primary osteoarthritis, left knee   2. Unilateral primary osteoarthritis, right knee     Plan: Impression is reasonably well-functioning right total knee replacement with end-stage arthritis in the left knee.  Plan is left total knee replacement matching the right-hand side.  She has good range of motion stability and strength on the right-hand side.  Left knee is really her limiting step at this time.  I think it is conceivable she could do some type of sedentary work.  I think that she should get over this left knee replacement quicker than she did the right one.  The risk benefits are discussed include not limited to infection nerve vessel damage stiffness incomplete pain relief as well as prolonged recovery.  Patient understands and knows that she may face at least one revision in her lifetime.  All  questions answered  Follow-Up Instructions: No follow-ups on file.   Orders:  No orders of the defined types were placed in this encounter.  Meds ordered this encounter  Medications  . traMADol (ULTRAM) 50 MG tablet    Sig: 1 po q d prn pain    Dispense:  35 tablet    Refill:  0      Procedures: No procedures performed   Clinical Data: No additional findings.  Objective: Vital Signs: LMP  (LMP Unknown)   Physical Exam:   Constitutional: Patient appears well-developed HEENT:  Head: Normocephalic Eyes:EOM are normal Neck: Normal range of motion Cardiovascular: Normal rate Pulmonary/chest: Effort normal Neurologic: Patient is alert Skin: Skin is warm Psychiatric: Patient has normal mood and affect    Ortho Exam: Ortho exam demonstrates mild effusion left knee with patellofemoral crepitus and joint line tenderness on the medial lateral side.  She has about a 5 degree flexion contracture with flexion to about 95 to 100 degrees.  Extensor mechanism is intact pedal pulses palpable.  No other masses lymphadenopathy or skin changes noted in that left knee region  Specialty Comments:  No specialty comments available.  Imaging: No results found.   PMFS History: Patient Active Problem List   Diagnosis Date Noted  . Arthritis of knee 12/22/2018  .  Unilateral primary osteoarthritis, right knee 02/05/2017   Past Medical History:  Diagnosis Date  . Anemia    history of  . Anxiety   . Arthritis   . Depression   . Hypertension   . Pre-diabetes     History reviewed. No pertinent family history.  Past Surgical History:  Procedure Laterality Date  . ABDOMINAL HYSTERECTOMY    . KNEE ARTHROSCOPY     right 2012, left 2018  . TOTAL KNEE ARTHROPLASTY Right 12/22/2018   Procedure: RIGHT TOTAL KNEE ARTHROPLASTY;  Surgeon: Meredith Pel, MD;  Location: [facility];  Service: Orthopedics;  Laterality: Right;  . WISDOM TOOTH EXTRACTION     Social History   Occupational  History  . Not on file  Tobacco Use  . Smoking status: Never Smoker  . Smokeless tobacco: Never Used  Substance and Sexual Activity  . Alcohol use: No  . Drug use: No  . Sexual activity: Yes    Birth control/protection: Surgical

## 2019-06-29 ENCOUNTER — Other Ambulatory Visit: Payer: Self-pay

## 2019-06-29 ENCOUNTER — Encounter: Payer: Self-pay | Admitting: Orthopedic Surgery

## 2019-06-29 DIAGNOSIS — M1712 Unilateral primary osteoarthritis, left knee: Secondary | ICD-10-CM

## 2019-06-29 DIAGNOSIS — Z96651 Presence of right artificial knee joint: Secondary | ICD-10-CM

## 2019-07-01 ENCOUNTER — Encounter: Payer: Self-pay | Admitting: Orthopedic Surgery

## 2019-07-02 ENCOUNTER — Telehealth: Payer: Self-pay

## 2019-07-02 NOTE — Telephone Encounter (Signed)
Faxed and mailed dept of labor auth form with clinical to dept of labor seeking surgery auth

## 2019-07-03 ENCOUNTER — Encounter: Payer: Self-pay | Admitting: Orthopedic Surgery

## 2019-07-08 ENCOUNTER — Telehealth: Payer: Self-pay | Admitting: Orthopedic Surgery

## 2019-07-08 NOTE — Telephone Encounter (Signed)
Called patient left message on voicemail to return call to schedule an appointment with Dr August Saucer for Lt arm and Rt knee pain.

## 2019-07-12 ENCOUNTER — Other Ambulatory Visit: Payer: Self-pay | Admitting: Surgical

## 2019-07-13 ENCOUNTER — Encounter: Payer: Self-pay | Admitting: Orthopedic Surgery

## 2019-07-15 ENCOUNTER — Ambulatory Visit (INDEPENDENT_AMBULATORY_CARE_PROVIDER_SITE_OTHER): Payer: Self-pay | Admitting: Orthopedic Surgery

## 2019-07-15 ENCOUNTER — Ambulatory Visit (INDEPENDENT_AMBULATORY_CARE_PROVIDER_SITE_OTHER): Payer: Federal, State, Local not specified - PPO

## 2019-07-15 ENCOUNTER — Other Ambulatory Visit: Payer: Self-pay

## 2019-07-15 DIAGNOSIS — M5412 Radiculopathy, cervical region: Secondary | ICD-10-CM | POA: Diagnosis not present

## 2019-07-15 MED ORDER — MELOXICAM 15 MG PO TABS: ORAL_TABLET | ORAL | 0 refills | Status: DC

## 2019-07-15 MED ORDER — MELOXICAM 15 MG PO TABS: 15.0000 mg | ORAL_TABLET | Freq: Every day | ORAL | 0 refills | Status: DC

## 2019-07-16 ENCOUNTER — Encounter: Payer: Self-pay | Admitting: Orthopedic Surgery

## 2019-07-16 NOTE — Progress Notes (Signed)
Office Visit Note   Patient: Beverly Salazar           Date of Birth: [birth year]           MRN: [id number] Visit Date: 07/15/2019 Requested by: Zoila Shutter, MD [address] [place],  [place] [address] PCP: Zoila Shutter, MD  Subjective: Chief Complaint  Patient presents with  . Right Knee - Follow-up  . Left Knee - Pain  . Neck - Pain  . Left Shoulder - Pain    HPI: Beverly Salazar is a patient who is doing well from her right total knee replacement.  Left knee is also painful.  She is having a second opinion about that tomorrow.  She is waiting on authorization for the left knee to be approved.  She describes 2-day history of left arm pain from elbow down.  Reports some numbness and tingling in the finger.  States that the arm feels tight but has only been going on for 2 days.  Not much in the way of neck pain.  Taking Tylenol as well as muscle relaxer.              ROS: All systems reviewed are negative as they relate to the chief complaint within the history of present illness.  Patient denies  fevers or chills.   Assessment & Plan: Visit Diagnoses:  1. Radiculopathy, cervical region     Plan: Impression is right knee is doing reasonably well.  She is 6 months out from that.  I think she is okay for sedentary duty from the perspective of the right knee.  She has left knee arthritis.  That may or may not get approved for knee replacement but essentially we have asked exhausted all other options for that.  The left arm is only been going on for 2 days.  May or may not be radiculopathy could be carpal tunnel.  We will try Mobic for 10 days and then as needed.  Follow-up with me as needed.  Okay for sedentary duty and that work note is done..  Follow-Up Instructions: Return if symptoms worsen or fail to improve.   Orders:  Orders Placed This Encounter  Procedures  . XR Cervical Spine 2 or 3 views   Meds ordered this encounter  Medications  . meloxicam (MOBIC)  15 MG tablet    Sig: Take 1 tablet (15 mg total) by mouth daily. 1 by mouth daily for 10 days then daily only as needed for pain thereafter    Dispense:  30 tablet    Refill:  0  . meloxicam (MOBIC) 15 MG tablet    Sig: 1 po q d x 10 days then q d prn    Dispense:  30 tablet    Refill:  0      Procedures: No procedures performed   Clinical Data: No additional findings.  Objective: Vital Signs: LMP  (LMP Unknown)   Physical Exam:   Constitutional: Patient appears well-developed HEENT:  Head: Normocephalic Eyes:EOM are normal Neck: Normal range of motion Cardiovascular: Normal rate Pulmonary/chest: Effort normal Neurologic: Patient is alert Skin: Skin is warm Psychiatric: Patient has normal mood and affect    Ortho Exam: Ortho exam demonstrates full active and passive range of motion of the arm and shoulder on the left.  Motor sensory function intact.  No definite paresthesias C5-T1.  Radial pulse intact bilaterally.  Neck range of motion full.  Right knee actually has good range  of motion and stability.  Left knee has some patellofemoral crepitus and pain with ambulation but no effusion today.  Specialty Comments:  No specialty comments available.  Imaging: XR Cervical Spine 2 or 3 views  Result Date: 07/15/2019 AP lateral cervical spine reviewed.  Some loss of lordosis is present.  Mild degenerative changes noted at C5-6 with minimal facet arthritis and minimal degenerative disc disease.  No compression fractures.    PMFS History: Patient Active Problem List   Diagnosis Date Noted  . Arthritis of knee 12/22/2018  . Unilateral primary osteoarthritis, right knee 02/05/2017   Past Medical History:  Diagnosis Date  . Anemia    history of  . Anxiety   . Arthritis   . Depression   . Hypertension   . Pre-diabetes     History reviewed. No pertinent family history.  Past Surgical History:  Procedure Laterality Date  . ABDOMINAL HYSTERECTOMY    . KNEE  ARTHROSCOPY     right 2012, left 2018  . TOTAL KNEE ARTHROPLASTY Right 12/22/2018   Procedure: RIGHT TOTAL KNEE ARTHROPLASTY;  Surgeon: Meredith Pel, MD;  Location: [facility];  Service: Orthopedics;  Laterality: Right;  . WISDOM TOOTH EXTRACTION     Social History   Occupational History  . Not on file  Tobacco Use  . Smoking status: Never Smoker  . Smokeless tobacco: Never Used  Substance and Sexual Activity  . Alcohol use: No  . Drug use: No  . Sexual activity: Yes    Birth control/protection: Surgical

## 2019-07-20 ENCOUNTER — Encounter: Payer: Self-pay | Admitting: Orthopedic Surgery

## 2019-07-26 ENCOUNTER — Telehealth: Payer: Self-pay | Admitting: Orthopedic Surgery

## 2019-07-26 NOTE — Telephone Encounter (Signed)
Called patient left message to return call to schedule an eval lt arm pain/right knee nerve reaction appointment with Dr August Saucer

## 2019-08-17 ENCOUNTER — Encounter: Payer: Self-pay | Admitting: Orthopedic Surgery

## 2019-08-19 MED ORDER — AMOXICILLIN 500 MG PO TABS: ORAL_TABLET | ORAL | 0 refills | Status: DC

## 2019-09-21 ENCOUNTER — Telehealth: Payer: Self-pay

## 2019-09-21 NOTE — Telephone Encounter (Signed)
Now able to submit auth request on dept of labor web site Submitted request for pts left TKA

## 2019-11-25 ENCOUNTER — Other Ambulatory Visit: Payer: Self-pay

## 2020-01-19 ENCOUNTER — Encounter (HOSPITAL_COMMUNITY)
Admission: RE | Admit: 2020-01-19 | Discharge: 2020-01-19 | Disposition: A | Discharge status: Home / Self Care | Source: Ambulatory Visit | Attending: Orthopedic Surgery | Admitting: Orthopedic Surgery

## 2020-01-19 ENCOUNTER — Other Ambulatory Visit: Payer: Self-pay

## 2020-01-19 ENCOUNTER — Encounter (HOSPITAL_COMMUNITY): Payer: Self-pay

## 2020-01-19 DIAGNOSIS — Z01812 Encounter for preprocedural laboratory examination: Secondary | ICD-10-CM | POA: Insufficient documentation

## 2020-01-19 HISTORY — DX: Gastro-esophageal reflux disease without esophagitis: K21.9

## 2020-01-19 HISTORY — DX: Dyspnea, unspecified: R06.00

## 2020-01-19 LAB — URINALYSIS, ROUTINE W REFLEX MICROSCOPIC
Bilirubin Urine: NEGATIVE
Glucose, UA: NEGATIVE mg/dL
Hgb urine dipstick: NEGATIVE
Ketones, ur: NEGATIVE mg/dL
Leukocytes,Ua: NEGATIVE
Nitrite: NEGATIVE
Protein, ur: NEGATIVE mg/dL
Specific Gravity, Urine: 1.023 (ref 1.005–1.030)
pH: 6 (ref 5.0–8.0)

## 2020-01-19 LAB — CBC
HCT: 39.7 % (ref 36.0–46.0)
Hemoglobin: 12.4 g/dL (ref 12.0–15.0)
MCH: 29 pg (ref 26.0–34.0)
MCHC: 31.2 g/dL (ref 30.0–36.0)
MCV: 93 fL (ref 80.0–100.0)
Platelets: 336 10*3/uL (ref 150–400)
RBC: 4.27 MIL/uL (ref 3.87–5.11)
RDW: 13.8 % (ref 11.5–15.5)
WBC: 4.7 10*3/uL (ref 4.0–10.5)
nRBC: 0 % (ref 0.0–0.2)

## 2020-01-19 LAB — BASIC METABOLIC PANEL
Anion gap: 10 (ref 5–15)
BUN: 9 mg/dL (ref 6–20)
CO2: 30 mmol/L (ref 22–32)
Calcium: 8.8 mg/dL — ABNORMAL LOW (ref 8.9–10.3)
Chloride: 101 mmol/L (ref 98–111)
Creatinine, Ser: 0.94 mg/dL (ref 0.44–1.00)
GFR calc Af Amer: >60 mL/min (ref >60)
GFR calc non Af Amer: >60 mL/min (ref >60)
Glucose, Bld: 102 mg/dL — ABNORMAL HIGH (ref 70–99)
Potassium: 3.2 mmol/L — ABNORMAL LOW (ref 3.5–5.1)
Sodium: 141 mmol/L (ref 135–145)

## 2020-01-19 LAB — SURGICAL PCR SCREEN
MRSA, PCR: NEGATIVE
Staphylococcus aureus: NEGATIVE

## 2020-01-19 NOTE — Progress Notes (Signed)
[id number] - [place], [place] - [address] [place] [address] Phone: [phone] Fax: [phone]      Your procedure is scheduled on Tuesday, August 17th.  Report to [facility] Main Entrance "A" at 5:30 A.M., and check in at the Admitting office.  Call this number if you have problems the morning of surgery:  [phone]  Call [phone] if you have any questions prior to your surgery date Monday-Friday 8am-4pm    Remember:  Do not eat after midnight the night before your surgery  You may drink clear liquids until 4:30 AM the morning of your surgery.   Clear liquids allowed are: Water, Non-Citrus Juices (without pulp), Carbonated Beverages, Clear Tea, Black Coffee Only, and Gatorade  Please finish your pre-surgery Ensure by 4:30 AM the morning of surgery.  Drink all in one sitting.  Do not sip.  Nothing else to drink after the Ensure.    Take these medicines the morning of surgery with A SIP OF WATER   Tylenol - if needed  Albuterol inhaler - if needed (bring with you on the day of surgery)  Alprazolam (Xanax) - if needed  Duloxetine (Cymbalta)   Follow your surgeon's instructions on when to stop Aspirin.  If no instructions were given by your surgeon then you will need to call the office to get those instructions.    Stop taking Phentermine (2) weeks prior to surgery.    As of today, STOP taking Aleve, Naproxen, Ibuprofen, Motrin, Advil, Goody's, BC's, all herbal medications, fish oil, and all vitamins.                      Do not wear jewelry, make up, or nail polish            Do not wear lotions, powders, perfumes, or deodorant.            Do not shave 48 hours prior to surgery.             Do not bring valuables to the hospital.            [facility] is not responsible for any belongings or valuables.  Do NOT Smoke (Tobacco/Vaping) or drink Alcohol 24 hours prior to your procedure If you use a  CPAP at night, you may bring all equipment for your overnight stay.   Contacts, glasses, dentures or bridgework may not be worn into surgery.      For patients admitted to the hospital, discharge time will be determined by your treatment team.   Patients discharged the day of surgery will not be allowed to drive home, and someone needs to stay with them for 24 hours.    Special instructions:   [facility]- Preparing For Surgery  Before surgery, you can play an important role. Because skin is not sterile, your skin needs to be as free of germs as possible. You can reduce the number of germs on your skin by washing with CHG (chlorahexidine gluconate) Soap before surgery.  CHG is an antiseptic cleaner which kills germs and bonds with the skin to continue killing germs even after washing.    Oral Hygiene is also important to reduce your risk of infection.  Remember - BRUSH YOUR TEETH THE MORNING OF SURGERY WITH YOUR REGULAR TOOTHPASTE  Please do not use if you have an allergy to CHG or antibacterial soaps. If your  skin becomes reddened/irritated stop using the CHG.  Do not shave (including legs and underarms) for at least 48 hours prior to first CHG shower. It is OK to shave your face.  Please follow these instructions carefully.   1. Shower the NIGHT BEFORE SURGERY and the MORNING OF SURGERY with CHG Soap.   2. If you chose to wash your hair, wash your hair first as usual with your normal shampoo.  3. After you shampoo, rinse your hair and body thoroughly to remove the shampoo.  4. Use CHG as you would any other liquid soap. You can apply CHG directly to the skin and wash gently with a scrungie or a clean washcloth.   5. Apply the CHG Soap to your body ONLY FROM THE NECK DOWN.  Do not use on open wounds or open sores. Avoid contact with your eyes, ears, mouth and genitals (private parts). Wash Face and genitals (private parts)  with your normal soap.   6. Wash thoroughly, paying special  attention to the area where your surgery will be performed.  7. Thoroughly rinse your body with warm water from the neck down.  8. DO NOT shower/wash with your normal soap after using and rinsing off the CHG Soap.  9. Pat yourself dry with a CLEAN TOWEL.  10. Wear CLEAN PAJAMAS to bed the night before surgery  11. Place CLEAN SHEETS on your bed the night of your first shower and DO NOT SLEEP WITH PETS.   Day of Surgery: Wear Clean/Comfortable clothing the morning of surgery Do not apply any deodorants/lotions.   Remember to brush your teeth WITH YOUR REGULAR TOOTHPASTE.   Please read over the following fact sheets that you were given.

## 2020-01-19 NOTE — Progress Notes (Signed)
PCP - Tilford Pillar Cardiologist - denies  Chest x-ray - n/a EKG - 12-15-18  Pt instructed to stop Phentermine ASAP.  Patient stated she stopped taking (2) days ago (01-17-20), because she ran out of medication. Patient stated understanding.   Aspirin Instructions: follow your surgeon's instructions on when to stop ASA.    ERAS Protcol - yes, Ensure given   COVID TEST- 01-21-20   Anesthesia review: n/a  Patient denies shortness of breath, fever, cough and chest pain at PAT appointment   All instructions explained to the patient, with a verbal understanding of the material. Patient agrees to go over the instructions while at home for a better understanding. Patient also instructed to self quarantine after being tested for COVID-19. The opportunity to ask questions was provided.

## 2020-01-20 ENCOUNTER — Encounter (HOSPITAL_COMMUNITY): Payer: Self-pay | Admitting: Orthopedic Surgery

## 2020-01-20 ENCOUNTER — Encounter: Payer: Self-pay | Admitting: Orthopedic Surgery

## 2020-01-20 LAB — URINE CULTURE: Culture: <10000 — AB

## 2020-01-21 ENCOUNTER — Other Ambulatory Visit (HOSPITAL_COMMUNITY)
Admission: RE | Admit: 2020-01-21 | Discharge: 2020-01-21 | Disposition: A | Discharge status: Home / Self Care | Payer: Federal, State, Local not specified - PPO | Source: Ambulatory Visit | Attending: Orthopedic Surgery | Admitting: Orthopedic Surgery

## 2020-01-21 DIAGNOSIS — Z01812 Encounter for preprocedural laboratory examination: Secondary | ICD-10-CM | POA: Diagnosis present

## 2020-01-21 DIAGNOSIS — Z20822 Contact with and (suspected) exposure to covid-19: Secondary | ICD-10-CM | POA: Insufficient documentation

## 2020-01-21 LAB — SARS CORONAVIRUS 2 (TAT 6-24 HRS): SARS Coronavirus 2: NEGATIVE

## 2020-01-24 NOTE — Anesthesia Preprocedure Evaluation (Addendum)
Anesthesia Evaluation  Patient identified by MRN, date of birth, ID band Patient awake    Reviewed: Allergy & Precautions, NPO status , Patient's Chart, lab work & pertinent test results  Airway Mallampati: III  TM Distance: >3 FB Neck ROM: Full    Dental  (+) Dental Advisory Given   Pulmonary    breath sounds clear to auscultation       Cardiovascular hypertension, Pt. on medications + DVT   Rhythm:Regular Rate:Normal     Neuro/Psych Anxiety Depression negative neurological ROS     GI/Hepatic Neg liver ROS, GERD  ,  Endo/Other  negative endocrine ROS  Renal/GU negative Renal ROS     Musculoskeletal   Abdominal   Peds  Hematology negative hematology ROS (+)   Anesthesia Other Findings   Reproductive/Obstetrics                            Lab Results  Component Value Date   WBC 4.7 01/19/2020   HGB 12.4 01/19/2020   HCT 39.7 01/19/2020   MCV 93.0 01/19/2020   PLT 336 01/19/2020   Lab Results  Component Value Date   CREATININE 0.94 01/19/2020   BUN 9 01/19/2020   NA 141 01/19/2020   K 3.2 (L) 01/19/2020   CL 101 01/19/2020   CO2 30 01/19/2020    Anesthesia Physical Anesthesia Plan  ASA: II  Anesthesia Plan: General   Post-op Pain Management:  Regional for Post-op pain   Induction: Intravenous  PONV Risk Score and Plan: 2 and Ondansetron, Treatment may vary due to age or medical condition, Dexamethasone and Midazolam  Airway Management Planned: LMA  Additional Equipment:   Intra-op Plan:   Post-operative Plan: Extubation in OR  Informed Consent: I have reviewed the patients History and Physical, chart, labs and discussed the procedure including the risks, benefits and alternatives for the proposed anesthesia with the patient or authorized representative who has indicated his/her understanding and acceptance.     Dental advisory given  Plan Discussed with:  CRNA  Anesthesia Plan Comments: (Pt refuses SAB. Plan general with LMA.)       Anesthesia Quick Evaluation

## 2020-01-25 ENCOUNTER — Encounter (HOSPITAL_COMMUNITY): Payer: Self-pay | Admitting: Orthopedic Surgery

## 2020-01-25 ENCOUNTER — Other Ambulatory Visit: Payer: Self-pay

## 2020-01-25 ENCOUNTER — Encounter (HOSPITAL_COMMUNITY)
Admission: RE | Disposition: A | Discharge status: Home / Self Care | Payer: Self-pay | Source: Ambulatory Visit | Attending: Orthopedic Surgery

## 2020-01-25 ENCOUNTER — Ambulatory Visit (HOSPITAL_COMMUNITY): Admitting: Certified Registered Nurse Anesthetist

## 2020-01-25 ENCOUNTER — Observation Stay (HOSPITAL_COMMUNITY)
Admission: RE | Admit: 2020-01-25 | Discharge: 2020-01-28 | Disposition: A | Discharge status: Home / Self Care | Source: Ambulatory Visit | Attending: Orthopedic Surgery | Admitting: Orthopedic Surgery

## 2020-01-25 DIAGNOSIS — M1712 Unilateral primary osteoarthritis, left knee: Principal | ICD-10-CM

## 2020-01-25 DIAGNOSIS — Z86718 Personal history of other venous thrombosis and embolism: Secondary | ICD-10-CM | POA: Insufficient documentation

## 2020-01-25 DIAGNOSIS — I10 Essential (primary) hypertension: Secondary | ICD-10-CM | POA: Insufficient documentation

## 2020-01-25 DIAGNOSIS — Z96651 Presence of right artificial knee joint: Secondary | ICD-10-CM | POA: Insufficient documentation

## 2020-01-25 DIAGNOSIS — Z96652 Presence of left artificial knee joint: Secondary | ICD-10-CM

## 2020-01-25 HISTORY — PX: TOTAL KNEE ARTHROPLASTY: SHX125

## 2020-01-25 HISTORY — DX: Acute embolism and thrombosis of unspecified deep veins of unspecified lower extremity: I82.409

## 2020-01-25 LAB — TYPE AND SCREEN
ABO/RH(D): O POS
Antibody Screen: NEGATIVE

## 2020-01-25 LAB — ABO/RH: ABO/RH(D): O POS

## 2020-01-25 SURGERY — ARTHROPLASTY, KNEE, TOTAL
Anesthesia: General | Site: Knee | Laterality: Left

## 2020-01-25 MED ORDER — RIVAROXABAN 10 MG PO TABS
10.0000 mg | ORAL_TABLET | Freq: Every day | ORAL | Status: DC
  Administered 2020-01-26 – 2020-01-28 (×3): 10 mg via ORAL
  Filled 2020-01-25 (×3): qty 1

## 2020-01-25 MED ORDER — TRAZODONE HCL 100 MG PO TABS
100.0000 mg | ORAL_TABLET | Freq: Every day | ORAL | Status: DC
  Administered 2020-01-25 – 2020-01-27 (×3): 100 mg via ORAL
  Filled 2020-01-25 (×3): qty 1

## 2020-01-25 MED ORDER — MIDAZOLAM HCL 5 MG/5ML IJ SOLN
INTRAMUSCULAR | Status: DC | PRN
  Administered 2020-01-25: 2 mg via INTRAVENOUS

## 2020-01-25 MED ORDER — DOCUSATE SODIUM 100 MG PO CAPS
100.0000 mg | ORAL_CAPSULE | Freq: Two times a day (BID) | ORAL | Status: DC
  Administered 2020-01-25 – 2020-01-28 (×5): 100 mg via ORAL
  Filled 2020-01-25 (×6): qty 1

## 2020-01-25 MED ORDER — BUPROPION HCL ER (XL) 150 MG PO TB24
150.0000 mg | ORAL_TABLET | Freq: Every day | ORAL | Status: DC
  Administered 2020-01-25 – 2020-01-27 (×3): 150 mg via ORAL
  Filled 2020-01-25 (×3): qty 1

## 2020-01-25 MED ORDER — PROPOFOL 10 MG/ML IV BOLUS
INTRAVENOUS | Status: DC | PRN
  Administered 2020-01-25: 200 mg via INTRAVENOUS

## 2020-01-25 MED ORDER — KETOROLAC TROMETHAMINE 30 MG/ML IJ SOLN
INTRAMUSCULAR | Status: AC
  Filled 2020-01-25: qty 1

## 2020-01-25 MED ORDER — AMISULPRIDE (ANTIEMETIC) 5 MG/2ML IV SOLN: 10.0000 mg | Freq: Once | INTRAVENOUS | Status: DC | PRN

## 2020-01-25 MED ORDER — CLONIDINE HCL (ANALGESIA) 100 MCG/ML EP SOLN
EPIDURAL | Status: DC | PRN
  Administered 2020-01-25: 1 mL

## 2020-01-25 MED ORDER — FENTANYL CITRATE (PF) 100 MCG/2ML IJ SOLN
INTRAMUSCULAR | Status: AC
  Filled 2020-01-25: qty 2

## 2020-01-25 MED ORDER — DULOXETINE HCL 60 MG PO CPEP
60.0000 mg | ORAL_CAPSULE | Freq: Every day | ORAL | Status: DC
  Administered 2020-01-26 – 2020-01-28 (×3): 60 mg via ORAL
  Filled 2020-01-25 (×3): qty 1

## 2020-01-25 MED ORDER — SODIUM CHLORIDE (PF) 0.9 % IJ SOLN
INTRAMUSCULAR | Status: AC
  Filled 2020-01-25: qty 50

## 2020-01-25 MED ORDER — ONDANSETRON HCL 4 MG/2ML IJ SOLN
INTRAMUSCULAR | Status: DC | PRN
  Administered 2020-01-25: 4 mg via INTRAVENOUS

## 2020-01-25 MED ORDER — PROPOFOL 10 MG/ML IV BOLUS
INTRAVENOUS | Status: AC
  Filled 2020-01-25: qty 20

## 2020-01-25 MED ORDER — DEXAMETHASONE SODIUM PHOSPHATE 4 MG/ML IJ SOLN
INTRAMUSCULAR | Status: DC | PRN
  Administered 2020-01-25: 10 mg via INTRAVENOUS

## 2020-01-25 MED ORDER — CHLORHEXIDINE GLUCONATE 0.12 % MT SOLN
15.0000 mL | Freq: Once | OROMUCOSAL | Status: AC
  Administered 2020-01-25: 15 mL via OROMUCOSAL

## 2020-01-25 MED ORDER — FENTANYL CITRATE (PF) 250 MCG/5ML IJ SOLN
INTRAMUSCULAR | Status: AC
  Filled 2020-01-25: qty 5

## 2020-01-25 MED ORDER — ROPIVACAINE HCL 5 MG/ML IJ SOLN
INTRAMUSCULAR | Status: DC | PRN
Start: 2020-01-25 — End: 2020-01-25
  Administered 2020-01-25: 20 mL via PERINEURAL

## 2020-01-25 MED ORDER — TRANEXAMIC ACID-NACL 1000-0.7 MG/100ML-% IV SOLN
1000.0000 mg | INTRAVENOUS | Status: AC
  Administered 2020-01-25: 1000 mg via INTRAVENOUS
  Filled 2020-01-25: qty 100

## 2020-01-25 MED ORDER — OXYCODONE HCL 5 MG PO TABS
5.0000 mg | ORAL_TABLET | ORAL | Status: DC | PRN
  Administered 2020-01-25 – 2020-01-27 (×13): 10 mg via ORAL
  Administered 2020-01-28: 5 mg via ORAL
  Administered 2020-01-28 (×3): 10 mg via ORAL
  Filled 2020-01-25 (×18): qty 2

## 2020-01-25 MED ORDER — KETOROLAC TROMETHAMINE 30 MG/ML IJ SOLN
30.0000 mg | Freq: Once | INTRAMUSCULAR | Status: AC
  Administered 2020-01-25: 30 mg via INTRAVENOUS

## 2020-01-25 MED ORDER — POVIDONE-IODINE 7.5 % EX SOLN: Freq: Once | CUTANEOUS | Status: DC

## 2020-01-25 MED ORDER — BUPIVACAINE-EPINEPHRINE 0.5% -1:200000 IJ SOLN
INTRAMUSCULAR | Status: AC
  Filled 2020-01-25: qty 1

## 2020-01-25 MED ORDER — ACETAMINOPHEN 10 MG/ML IV SOLN: 1000.0000 mg | Freq: Four times a day (QID) | INTRAVENOUS | Status: DC

## 2020-01-25 MED ORDER — BUPROPION HCL ER (XL) 300 MG PO TB24
300.0000 mg | ORAL_TABLET | Freq: Every day | ORAL | Status: DC
  Administered 2020-01-25 – 2020-01-27 (×3): 300 mg via ORAL
  Filled 2020-01-25 (×3): qty 1

## 2020-01-25 MED ORDER — SODIUM CHLORIDE 0.9% FLUSH
INTRAVENOUS | Status: DC | PRN
  Administered 2020-01-25: 20 mL via INTRAVENOUS

## 2020-01-25 MED ORDER — DEXAMETHASONE SODIUM PHOSPHATE 10 MG/ML IJ SOLN
INTRAMUSCULAR | Status: AC
  Filled 2020-01-25: qty 1

## 2020-01-25 MED ORDER — ACETAMINOPHEN 10 MG/ML IV SOLN
INTRAVENOUS | Status: AC
  Filled 2020-01-25: qty 100

## 2020-01-25 MED ORDER — MORPHINE SULFATE (PF) 4 MG/ML IV SOLN
INTRAVENOUS | Status: AC
  Filled 2020-01-25: qty 2

## 2020-01-25 MED ORDER — LIDOCAINE 2% (20 MG/ML) 5 ML SYRINGE
INTRAMUSCULAR | Status: DC | PRN
  Administered 2020-01-25: 20 mg via INTRAVENOUS

## 2020-01-25 MED ORDER — TRANEXAMIC ACID 1000 MG/10ML IV SOLN
2000.0000 mg | Freq: Once | INTRAVENOUS | Status: DC
  Filled 2020-01-25: qty 20

## 2020-01-25 MED ORDER — CEFAZOLIN SODIUM-DEXTROSE 2-4 GM/100ML-% IV SOLN
2.0000 g | Freq: Four times a day (QID) | INTRAVENOUS | Status: AC
  Administered 2020-01-25 (×2): 2 g via INTRAVENOUS
  Filled 2020-01-25 (×2): qty 100

## 2020-01-25 MED ORDER — METHOCARBAMOL 500 MG PO TABS
500.0000 mg | ORAL_TABLET | Freq: Four times a day (QID) | ORAL | Status: DC | PRN
  Administered 2020-01-25 – 2020-01-27 (×7): 500 mg via ORAL
  Filled 2020-01-25 (×7): qty 1

## 2020-01-25 MED ORDER — METOCLOPRAMIDE HCL 5 MG PO TABS
5.0000 mg | ORAL_TABLET | Freq: Three times a day (TID) | ORAL | Status: DC | PRN
  Filled 2020-01-25: qty 2

## 2020-01-25 MED ORDER — LACTATED RINGERS IV SOLN: INTRAVENOUS | Status: DC

## 2020-01-25 MED ORDER — ACETAMINOPHEN 10 MG/ML IV SOLN
1000.0000 mg | Freq: Once | INTRAVENOUS | Status: AC
  Administered 2020-01-25: 1000 mg via INTRAVENOUS

## 2020-01-25 MED ORDER — METHOCARBAMOL 500 MG IVPB - SIMPLE MED
INTRAVENOUS | Status: AC
  Filled 2020-01-25: qty 50

## 2020-01-25 MED ORDER — METHOCARBAMOL 500 MG IVPB - SIMPLE MED
500.0000 mg | Freq: Four times a day (QID) | INTRAVENOUS | Status: DC | PRN
  Filled 2020-01-25: qty 50

## 2020-01-25 MED ORDER — POVIDONE-IODINE 10 % EX SWAB: 2.0000 "application " | Freq: Once | CUTANEOUS | Status: DC

## 2020-01-25 MED ORDER — METOCLOPRAMIDE HCL 5 MG/ML IJ SOLN: 5.0000 mg | Freq: Three times a day (TID) | INTRAMUSCULAR | Status: DC | PRN

## 2020-01-25 MED ORDER — BUPIVACAINE LIPOSOME 1.3 % IJ SUSP
20.0000 mL | Freq: Once | INTRAMUSCULAR | Status: DC
  Filled 2020-01-25: qty 20

## 2020-01-25 MED ORDER — POVIDONE-IODINE 10 % EX SWAB
2.0000 "application " | Freq: Once | CUTANEOUS | Status: AC
  Administered 2020-01-25: 2 via TOPICAL

## 2020-01-25 MED ORDER — PHENOL 1.4 % MT LIQD: 1.0000 | OROMUCOSAL | Status: DC | PRN

## 2020-01-25 MED ORDER — ACETAMINOPHEN 500 MG PO TABS
1000.0000 mg | ORAL_TABLET | Freq: Four times a day (QID) | ORAL | Status: AC
  Administered 2020-01-25 – 2020-01-26 (×3): 1000 mg via ORAL
  Filled 2020-01-25 (×3): qty 2

## 2020-01-25 MED ORDER — HYDROMORPHONE HCL 1 MG/ML IJ SOLN
0.5000 mg | INTRAMUSCULAR | Status: DC | PRN
  Administered 2020-01-26: 0.5 mg via INTRAVENOUS
  Filled 2020-01-25: qty 0.5

## 2020-01-25 MED ORDER — CELECOXIB 200 MG PO CAPS
200.0000 mg | ORAL_CAPSULE | Freq: Two times a day (BID) | ORAL | Status: DC
  Administered 2020-01-25 – 2020-01-28 (×6): 200 mg via ORAL
  Filled 2020-01-25 (×6): qty 1

## 2020-01-25 MED ORDER — VANCOMYCIN HCL 1000 MG IV SOLR
INTRAVENOUS | Status: AC
  Filled 2020-01-25: qty 1000

## 2020-01-25 MED ORDER — ALBUTEROL SULFATE (2.5 MG/3ML) 0.083% IN NEBU: 2.5000 mg | INHALATION_SOLUTION | Freq: Four times a day (QID) | RESPIRATORY_TRACT | Status: DC | PRN

## 2020-01-25 MED ORDER — FENTANYL CITRATE (PF) 100 MCG/2ML IJ SOLN
25.0000 ug | INTRAMUSCULAR | Status: DC | PRN
  Administered 2020-01-25 (×2): 50 ug via INTRAVENOUS

## 2020-01-25 MED ORDER — VANCOMYCIN HCL 1 G IV SOLR
INTRAVENOUS | Status: DC | PRN
  Administered 2020-01-25: 1000 mg via TOPICAL

## 2020-01-25 MED ORDER — 0.9 % SODIUM CHLORIDE (POUR BTL) OPTIME
TOPICAL | Status: DC | PRN
  Administered 2020-01-25: 1300 mL
  Administered 2020-01-25: 1000 mL

## 2020-01-25 MED ORDER — ISOPROPYL ALCOHOL 70 % SOLN
Status: AC
  Filled 2020-01-25: qty 480

## 2020-01-25 MED ORDER — LIDOCAINE 2% (20 MG/ML) 5 ML SYRINGE
INTRAMUSCULAR | Status: AC
  Filled 2020-01-25: qty 5

## 2020-01-25 MED ORDER — MIDAZOLAM HCL 2 MG/2ML IJ SOLN
INTRAMUSCULAR | Status: AC
  Filled 2020-01-25: qty 2

## 2020-01-25 MED ORDER — HYDROGEN PEROXIDE 3 % EX SOLN
CUTANEOUS | Status: AC
  Filled 2020-01-25: qty 473

## 2020-01-25 MED ORDER — ONDANSETRON HCL 4 MG/2ML IJ SOLN
INTRAMUSCULAR | Status: AC
  Filled 2020-01-25: qty 2

## 2020-01-25 MED ORDER — CEFAZOLIN SODIUM-DEXTROSE 2-4 GM/100ML-% IV SOLN
2.0000 g | INTRAVENOUS | Status: AC
  Administered 2020-01-25: 2 g via INTRAVENOUS
  Filled 2020-01-25: qty 100

## 2020-01-25 MED ORDER — HYDROCHLOROTHIAZIDE 25 MG PO TABS
25.0000 mg | ORAL_TABLET | Freq: Every day | ORAL | Status: DC
  Administered 2020-01-27 – 2020-01-28 (×2): 25 mg via ORAL
  Filled 2020-01-25 (×2): qty 1

## 2020-01-25 MED ORDER — MENTHOL 3 MG MT LOZG: 1.0000 | LOZENGE | OROMUCOSAL | Status: DC | PRN

## 2020-01-25 MED ORDER — ORAL CARE MOUTH RINSE: 15.0000 mL | Freq: Once | OROMUCOSAL | Status: AC

## 2020-01-25 MED ORDER — SODIUM CHLORIDE 0.9 % IR SOLN
Status: DC | PRN
  Administered 2020-01-25: 3000 mL

## 2020-01-25 MED ORDER — ONDANSETRON HCL 4 MG PO TABS
4.0000 mg | ORAL_TABLET | Freq: Four times a day (QID) | ORAL | Status: DC | PRN
  Filled 2020-01-25: qty 1

## 2020-01-25 MED ORDER — MORPHINE SULFATE 4 MG/ML IJ SOLN
INTRAMUSCULAR | Status: DC | PRN
  Administered 2020-01-25: 8 mg via INTRAVENOUS

## 2020-01-25 MED ORDER — ONDANSETRON HCL 4 MG/2ML IJ SOLN
4.0000 mg | Freq: Four times a day (QID) | INTRAMUSCULAR | Status: DC | PRN
  Administered 2020-01-25 – 2020-01-26 (×2): 4 mg via INTRAVENOUS
  Filled 2020-01-25 (×2): qty 2

## 2020-01-25 MED ORDER — FENTANYL CITRATE (PF) 100 MCG/2ML IJ SOLN
INTRAMUSCULAR | Status: DC | PRN
  Administered 2020-01-25 (×3): 50 ug via INTRAVENOUS
  Administered 2020-01-25: 100 ug via INTRAVENOUS
  Administered 2020-01-25 (×2): 50 ug via INTRAVENOUS

## 2020-01-25 MED ORDER — CLONIDINE HCL (ANALGESIA) 100 MCG/ML EP SOLN
EPIDURAL | Status: DC | PRN
  Administered 2020-01-25: 50 ug

## 2020-01-25 MED ORDER — TRAZODONE HCL 50 MG PO TABS: 50.0000 mg | ORAL_TABLET | Freq: Every evening | ORAL | Status: DC | PRN

## 2020-01-25 SURGICAL SUPPLY — 74 items
BAG DECANTER FOR FLEXI CONT (MISCELLANEOUS) ×3 IMPLANT
BAG ZIPLOCK 12X15 (MISCELLANEOUS) ×3 IMPLANT
BLADE HEX COATED 2.75 (ELECTRODE) ×3 IMPLANT
BLADE SAG 18X100X1.27 (BLADE) ×3 IMPLANT
BLADE SURG 15 STRL LF DISP TIS (BLADE) ×1 IMPLANT
BLADE SURG 15 STRL SS (BLADE) ×3
BNDG COHESIVE 6X5 TAN STRL LF (GAUZE/BANDAGES/DRESSINGS) ×3 IMPLANT
BNDG ELASTIC 6X10 VLCR STRL LF (GAUZE/BANDAGES/DRESSINGS) ×3 IMPLANT
BNDG ELASTIC 6X15 VLCR STRL LF (GAUZE/BANDAGES/DRESSINGS) ×3 IMPLANT
BOWL SMART MIX CTS (DISPOSABLE) IMPLANT
CLOSURE WOUND 1/2 X4 (GAUZE/BANDAGES/DRESSINGS) ×2
COVER SURGICAL LIGHT HANDLE (MISCELLANEOUS) ×3 IMPLANT
COVER WAND RF STERILE (DRAPES) IMPLANT
CUFF TOURN SGL QUICK 34 (TOURNIQUET CUFF) ×3
CUFF TOURN SGL QUICK 42 (TOURNIQUET CUFF) IMPLANT
CUFF TRNQT CYL 34X4.125X (TOURNIQUET CUFF) ×1 IMPLANT
DECANTER SPIKE VIAL GLASS SM (MISCELLANEOUS) ×3 IMPLANT
DRAPE INCISE IOBAN 66X45 STRL (DRAPES) ×6 IMPLANT
DRAPE ORTHO SPLIT 77X108 STRL (DRAPES) ×6
DRAPE SHEET LG 3/4 BI-LAMINATE (DRAPES) ×3 IMPLANT
DRAPE SURG ORHT 6 SPLT 77X108 (DRAPES) ×2 IMPLANT
DRAPE U-SHAPE 47X51 STRL (DRAPES) ×3 IMPLANT
DRSG AQUACEL AG ADV 3.5X14 (GAUZE/BANDAGES/DRESSINGS) ×3 IMPLANT
DURAPREP 26ML APPLICATOR (WOUND CARE) ×6 IMPLANT
ELECT REM PT RETURN 15FT ADLT (MISCELLANEOUS) ×3 IMPLANT
EVACUATOR 1/8 PVC DRAIN (DRAIN) IMPLANT
FEMORAL RETAINING CRUC KNEE #4 (Knees) ×3 IMPLANT
GAUZE SPONGE 4X4 12PLY STRL (GAUZE/BANDAGES/DRESSINGS) ×3 IMPLANT
GAUZE XEROFORM 5X9 LF (GAUZE/BANDAGES/DRESSINGS) IMPLANT
GLOVE BIO SURGEON STRL SZ7 (GLOVE) ×3 IMPLANT
GLOVE BIOGEL PI IND STRL 7.0 (GLOVE) ×1 IMPLANT
GLOVE BIOGEL PI IND STRL 8 (GLOVE) ×1 IMPLANT
GLOVE BIOGEL PI INDICATOR 7.0 (GLOVE) ×2
GLOVE BIOGEL PI INDICATOR 8 (GLOVE) ×2
GLOVE SURG ORTHO 8.0 STRL STRW (GLOVE) ×3 IMPLANT
GOWN STRL REUS W/ TWL LRG LVL3 (GOWN DISPOSABLE) ×4 IMPLANT
GOWN STRL REUS W/TWL LRG LVL3 (GOWN DISPOSABLE) ×12
HANDPIECE INTERPULSE COAX TIP (DISPOSABLE) ×3
HOLDER FOLEY CATH W/STRAP (MISCELLANEOUS) IMPLANT
HOOD PEEL AWAY FLYTE STAYCOOL (MISCELLANEOUS) ×12 IMPLANT
IMMOBILIZER KNEE 20 (SOFTGOODS)
IMMOBILIZER KNEE 20 THIGH 36 (SOFTGOODS) IMPLANT
IMMOBILIZER KNEE 22 UNIV (SOFTGOODS) ×3 IMPLANT
INSERT TIB CS TRIATH SZ5 13 (Insert) ×2 IMPLANT
JET LAVAGE IRRISEPT WOUND (IRRIGATION / IRRIGATOR) ×3
KIT TURNOVER KIT A (KITS) IMPLANT
KNEE PATELLA ASYMMETRIC 10X32 (Knees) ×3 IMPLANT
KNEE TIBIAL COMPONENT SZ5 (Knees) ×3 IMPLANT
LAVAGE JET IRRISEPT WOUND (IRRIGATION / IRRIGATOR) ×1 IMPLANT
MANIFOLD NEPTUNE II (INSTRUMENTS) ×3 IMPLANT
NDL SAFETY ECLIPSE 18X1.5 (NEEDLE) ×1 IMPLANT
NEEDLE HYPO 18GX1.5 SHARP (NEEDLE) ×3
NEEDLE HYPO 22GX1.5 SAFETY (NEEDLE) ×6 IMPLANT
NS IRRIG 1000ML POUR BTL (IV SOLUTION) ×3 IMPLANT
PACK TOTAL KNEE CUSTOM (KITS) ×3 IMPLANT
PADDING CAST COTTON 6X4 STRL (CAST SUPPLIES) ×6 IMPLANT
PENCIL SMOKE EVACUATOR (MISCELLANEOUS) IMPLANT
PIN FLUTED HEDLESS FIX 3.5X1/8 (PIN) ×3 IMPLANT
PROTECTOR NERVE ULNAR (MISCELLANEOUS) ×3 IMPLANT
SET HNDPC FAN SPRY TIP SCT (DISPOSABLE) ×1 IMPLANT
STAPLER VISISTAT 35W (STAPLE) IMPLANT
STRIP CLOSURE SKIN 1/2X4 (GAUZE/BANDAGES/DRESSINGS) ×4 IMPLANT
SUT ETHILON 3 0 PS 1 (SUTURE) ×9 IMPLANT
SUT MNCRL AB 3-0 PS2 18 (SUTURE) ×3 IMPLANT
SUT VIC AB 0 CT1 36 (SUTURE) ×12 IMPLANT
SUT VIC AB 1 CT1 36 (SUTURE) ×12 IMPLANT
SUT VIC AB 2-0 CT1 27 (SUTURE) ×12
SUT VIC AB 2-0 CT1 TAPERPNT 27 (SUTURE) ×4 IMPLANT
SYR 20ML LL LF (SYRINGE) ×3 IMPLANT
SYR 30ML LL (SYRINGE) ×9 IMPLANT
TOWEL OR 17X26 10 PK STRL BLUE (TOWEL DISPOSABLE) ×9 IMPLANT
TRAY FOLEY MTR SLVR 16FR STAT (SET/KITS/TRAYS/PACK) ×3 IMPLANT
TRIATHLON TIBIAL BEARING INSERT (Knees) ×3 IMPLANT
WATER STERILE IRR 1000ML POUR (IV SOLUTION) ×6 IMPLANT

## 2020-01-25 NOTE — Progress Notes (Signed)
PT Cancellation Note  Patient Details Name: Beverly Salazar MRN: [id number] DOB: 03/08/1966   Cancelled Treatment:    Reason Eval/Treat Not Completed: Other (comment) Upon checking on pt, she had just received nausea meds.  Pt now eating (has been waiting as her first tray was cold per NT).  Will check back as schedule permits.   Isabelle Matt,KATHrine E 01/25/2020, 3:57 PM Thomasene Mohair PT, DPT Acute Rehabilitation Services Pager: [phone] Office: [phone]

## 2020-01-25 NOTE — Anesthesia Procedure Notes (Signed)
Procedure Name: LMA Insertion Date/Time: 01/25/2020 7:39 AM Performed by: Vanessa Evanston, CRNA Pre-anesthesia Checklist: Emergency Drugs available, Patient identified, Suction available and Patient being monitored Patient Re-evaluated:Patient Re-evaluated prior to induction Oxygen Delivery Method: Circle system utilized Preoxygenation: Pre-oxygenation with 100% oxygen Induction Type: IV induction Ventilation: Mask ventilation without difficulty LMA: LMA inserted LMA Size: 4.0 Number of attempts: 1 Placement Confirmation: positive ETCO2 and breath sounds checked- equal and bilateral Tube secured with: Tape Dental Injury: Teeth and Oropharynx as per pre-operative assessment

## 2020-01-25 NOTE — H&P (Signed)
TOTAL KNEE ADMISSION H&P  Patient is being admitted for left total knee arthroplasty.  Subjective:  Chief Complaint:left knee pain.  HPI: Beverly Salazar, 54 y.o. female, has a history of pain and functional disability in the left knee due to arthritis and has failed non-surgical conservative treatments for greater than 12 weeks to includeNSAID's and/or analgesics, corticosteriod injections, viscosupplementation injections, flexibility and strengthening excercises and activity modification.  Onset of symptoms was gradual, starting 7 years ago with gradually worsening course since that time. The patient noted prior procedures on the knee to include  arthroscopy and menisectomy on the left knee(s).  Patient currently rates pain in the left knee(s) at 8 out of 10 with activity. Patient has night pain, worsening of pain with activity and weight bearing, pain that interferes with activities of daily living, pain with passive range of motion, crepitus and joint swelling.  Patient has evidence of subchondral sclerosis and joint space narrowing by imaging studies. This patient has had A good result with right total knee replacement done several years ago.. There is no active infection.  Patient Active Problem List   Diagnosis Date Noted   Arthritis of knee 12/22/2018   Unilateral primary osteoarthritis, right knee 02/05/2017   Past Medical History:  Diagnosis Date   Anemia    history of   Anxiety    Arthritis    Depression    DVT (deep venous thrombosis) (HCC)    Right calf, has had DVT's twice   Dyspnea    upon exertion   GERD (gastroesophageal reflux disease)    Hypertension    Pre-diabetes     Past Surgical History:  Procedure Laterality Date   ABDOMINAL HYSTERECTOMY     KNEE ARTHROSCOPY     right 2012, left 2018   TOTAL KNEE ARTHROPLASTY Right 12/22/2018   Procedure: RIGHT TOTAL KNEE ARTHROPLASTY;  Surgeon: Cammy Copa, MD;  Location: [facility] OR;  Service: Orthopedics;   Laterality: Right;   WISDOM TOOTH EXTRACTION      Current Facility-Administered Medications  Medication Dose Route Frequency Provider Last Rate Last Admin   ceFAZolin (ANCEF) IVPB 2g/100 mL premix  2 g Intravenous On Call to OR Magnant, Joycie Peek, PA-C       lactated ringers infusion   Intravenous Continuous Val Eagle, MD 10 mL/hr at 01/25/20 0618 New Bag at 01/25/20 0618   povidone-iodine (BETADINE) 7.5 % scrub   Topical Once Magnant, Charles L, PA-C       tranexamic acid (CYKLOKAPRON) IVPB 1,000 mg  1,000 mg Intravenous To OR Magnant, Charles L, PA-C       Allergies  Allergen Reactions   Tetanus Toxoids Swelling    Redness and swelling around injection site    Social History   Tobacco Use   Smoking status: Never Smoker   Smokeless tobacco: Never Used  Substance Use Topics   Alcohol use: No    History reviewed. No pertinent family history.   Review of Systems  Musculoskeletal: Positive for arthralgias.  All other systems reviewed and are negative.   Objective:  Physical Exam Vitals reviewed.  HENT:     Head: Normocephalic.     Nose: Nose normal.     Mouth/Throat:     Mouth: Mucous membranes are moist.  Eyes:     Pupils: Pupils are equal, round, and reactive to light.  Cardiovascular:     Rate and Rhythm: Normal rate.     Pulses: Normal pulses.  Pulmonary:     Effort:  Pulmonary effort is normal.  Abdominal:     Palpations: Abdomen is soft.  Musculoskeletal:     Cervical back: Normal range of motion.  Skin:    General: Skin is warm.     Capillary Refill: Capillary refill takes less than 2 seconds.  Neurological:     General: No focal deficit present.     Mental Status: She is alert.  Psychiatric:        Mood and Affect: Mood normal.   Examination of the left knee demonstrates intact skin.  Less than 5 degree flexion contracture.  Flexion is to about 110.  Collateral crucial ligaments are stable.  Pedal pulses palpable.  Ankle dorsiflexion  intact.  Vital signs in last 24 hours: Temp:  [98.6 F (37 C)] 98.6 F (37 C) (08/17 0614) Pulse Rate:  [64] 64 (08/17 0614) Resp:  [16] 16 (08/17 0614) BP: (126)/(88) 126/88 (08/17 0614) SpO2:  [100 %] 100 % (08/17 0614) Weight:  [112.4 kg] 112.4 kg (08/17 0543)  Labs:   Estimated body mass index is 37.66 kg/m as calculated from the following:   Height as of this encounter: 5\' 8"  (1.727 m).   Weight as of this encounter: 112.4 kg.   Imaging Review Plain radiographs demonstrate moderate degenerative joint disease of the left knee(s). The overall alignment ismild varus. The bone quality appears to be excellent for age and reported activity level.      Assessment/Plan:  End stage arthritis, left knee   The patient history, physical examination, clinical judgment of the provider and imaging studies are consistent with end stage degenerative joint disease of the left knee(s) and total knee arthroplasty is deemed medically necessary. The treatment options including medical management, injection therapy arthroscopy and arthroplasty were discussed at length. The risks and benefits of total knee arthroplasty were presented and reviewed. The risks due to aseptic loosening, infection, stiffness, patella tracking problems, thromboembolic complications and other imponderables were discussed. The patient acknowledged the explanation, agreed to proceed with the plan and consent was signed. Patient is being admitted for inpatient treatment for surgery, pain control, PT, OT, prophylactic antibiotics, VTE prophylaxis, progressive ambulation and ADL's and discharge planning. The patient is planning to be discharged home with home health services     Patient's anticipated LOS is less than 2 midnights, meeting these requirements: - Younger than 51 - Lives within 1 hour of care - Has a competent adult at home to recover with post-op recover - NO history of  - Chronic pain requiring opiods  -  Diabetes  - Coronary Artery Disease  - Heart failure  - Heart attack  - Stroke  - DVT/VTE  - Cardiac arrhythmia  - Respiratory Failure/COPD  - Renal failure  - Anemia  - Advanced Liver disease

## 2020-01-25 NOTE — Anesthesia Postprocedure Evaluation (Signed)
Anesthesia Post Note  Patient: Beverly Salazar  Procedure(s) Performed: LEFT TOTAL KNEE ARTHROPLASTY (Left Knee)     Patient location during evaluation: PACU Anesthesia Type: General Level of consciousness: awake and alert Pain management: pain level controlled Vital Signs Assessment: post-procedure vital signs reviewed and stable Respiratory status: spontaneous breathing, nonlabored ventilation, respiratory function stable and patient connected to nasal cannula oxygen Cardiovascular status: blood pressure returned to baseline and stable Postop Assessment: no apparent nausea or vomiting Anesthetic complications: no   No complications documented.  Last Vitals:  Vitals:   01/25/20 1130 01/25/20 1145  BP: 126/79   Pulse: 75 71  Resp: 15 15  Temp:    SpO2: 100% 100%    Last Pain:  Vitals:   01/25/20 1145  TempSrc:   PainSc: 0-No pain                 Kennieth Rad

## 2020-01-25 NOTE — Op Note (Signed)
NAME: Beverly Salazar, CRUSOE MEDICAL RECORD [id number] ACCOUNT [id number] DATE OF BIRTH:[birth year] FACILITY: [facility] LOCATION: [id number] PHYSICIAN:Georgian Mcclory Diamantina Providence, MD  OPERATIVE REPORT  DATE OF PROCEDURE:  01/25/2020  PREOPERATIVE DIAGNOSIS:  Left knee arthritis.  POSTOPERATIVE DIAGNOSIS:  Left knee arthritis.  PROCEDURE:  Left total knee replacement using [employer] Triathlon press-fit component size 4 posterior cruciate retaining femur, size 5 tibial baseplate press-fit, 13 mm polyethylene insert with 32 mm 3-peg press-fit the patella.  SURGEON:  Rise Paganini MD  ASSISTANT:  Karenann Cai, PA  INDICATIONS:  The patient is a 54 year old patient with left knee pain.  She has end-stage left knee arthritis, who presents for operative management after explanation of risks and benefits.  DESCRIPTION OF PROCEDURE:  The patient was brought to the operating room where general anesthetic was induced.  Preoperative antibiotics administered.  Timeout was called.  Left leg was scrubbed with alcohol, Betadine and allowed to air dry, prepped with  DuraPrep solution and draped in a sterile manner.  Left knee was covered with Ioban.  Time-out was called and left leg was then elevated and exsanguinated with the Esmarch wrap.  Tourniquet was inflated.  Tourniquet time 75 minutes at 300 mmHg.   Anterior approach to the knee was made.  Skin and subcutaneous tissue was sharply divided.  IrriSept solution was used to irrigate the subcutaneous tissues, along with the knee throughout the case to diminish infection risk.  Arthrotomy was made, medial  parapatellar and marked with a #1 Vicryl suture.  IrriSept solution again used.  Medial patellofemoral ligament was released.  Soft tissue removed from the anterior distal femur.  Fat pad partially excised.  Minimal soft tissue dissection was performed  on the medial aspect of the knee.  Next, the knee was flexed.  The patella was everted.  Osteophytes were removed.  The  intramedullary alignment was then used to cut the tibia perpendicular to the mechanical access 9 mm off the least affected lateral  tibial plateau.  Then, the femur was cut 8 mm in 5 degrees of valgus.  Stability was then checked in extension.  The knee had about 4 or 5 degrees of hyperextension with an 11 mm spacer.  A 13 mm spacer gave good stability of varus and valgus stress.   The patient did have some hyperlaxity to begin with in terms of hyperextension of the left knee at rest.  Next, the femur was sized to a size 4.  Anterior, posterior, chamfer cuts were made.  The tibia was keel punch.  Trial components were placed.  The  patella was then cut from 26 to 16 mm.  Three-peg trial patellar button was placed.  With trial components in position, including both an 11 and 13 mm spacer, the patient had full extension, very good stability of varus and valgus stress at 0, 30 and 90  degrees, no lift off with a 13 mm spacer.  An 11 mm spacer gave slightly too much hyperextension.  Trial components were removed.  Final preparation performed on the tibia.  Next, a thorough irrigation was performed with 3 L of irrigating solution and  IrriSept solution.  The components were then tapped into position.  Excellent press-fit was obtained.  Same stability parameters were maintained.  Tourniquet was released.  Bleeding points were controlled using electrocautery.  Pouring irrigation  utilized at this time.  Medial arthrotomy was closed over a bolster using #1 Vicryl suture, followed by interrupted inverted 0 Vicryl suture, 2-0 Vicryl suture and a 3-0  Monocryl.  A solution of Marcaine, morphine, clonidine injected into the knee for  postoperative pain relief.  The patient tolerated the procedure well without immediate complications.  Steri-Strips and Aquacel dressing placed, along with an Ace wrap and knee immobilizer.  The patient tolerated the procedure well without immediate  complications.  She was transferred to  the recovery room in stable condition.  Luke's assistance was required at all times for opening and closing, tissue mobilization and limb positioning.  His assistance was a medical necessity.  VN/NUANCE  D:01/25/2020 T:01/25/2020 JOB:[id number]

## 2020-01-25 NOTE — Anesthesia Procedure Notes (Signed)
Anesthesia Regional Block: Adductor canal block   Pre-Anesthetic Checklist: ,, timeout performed, Correct Patient, Correct Site, Correct Laterality, Correct Procedure, Correct Position, site marked, Risks and benefits discussed,  Surgical consent,  Pre-op evaluation,  At surgeon's request and post-op pain management  Laterality: Left  Prep: chloraprep       Needles:  Injection technique: Single-shot  Needle Type: Echogenic Needle     Needle Length: 9cm  Needle Gauge: 21     Additional Needles:   Procedures:,,,, ultrasound used (permanent image in chart),,,,  Narrative:  Start time: 01/25/2020 6:57 AM End time: 01/25/2020 7:04 AM Injection made incrementally with aspirations every 5 mL.  Performed by: Personally  Anesthesiologist: Marcene Duos, MD

## 2020-01-25 NOTE — Brief Op Note (Signed)
   01/25/2020  10:13 AM  PATIENT:  Beverly Salazar  54 y.o. female  PRE-OPERATIVE DIAGNOSIS:  left knee osteoarthritis  POST-OPERATIVE DIAGNOSIS:  left knee osteoarthritis  PROCEDURE:  Procedure(s): LEFT TOTAL KNEE ARTHROPLASTY  SURGEON:  Surgeon(s): Cammy Copa, MD  ASSISTANT: magnant pa  ANESTHESIA:   general  EBL: 75 ml    Total I/O In: 1200 [I.V.:1000; IV Piggyback:200] Out: 100 [Blood:100]  BLOOD ADMINISTERED: none  DRAINS: none   LOCAL MEDICATIONS USED:  Marcaine mso4 clonidine exparel  SPECIMEN:  No Specimen  COUNTS:  YES  TOURNIQUET:   Total Tourniquet Time Documented: Thigh (Left) - 75 minutes Total: Thigh (Left) - 75 minutes   DICTATION: .Other Dictation: Dictation Number [id number]  PLAN OF CARE: Admit for overnight observation  PATIENT DISPOSITION:  PACU - hemodynamically stable

## 2020-01-25 NOTE — Progress Notes (Signed)
Orthopedic Tech Progress Note Patient Details:  Beverly Salazar 1965-10-19 [id number] Applied cpm at 10:40 CPM Left Knee CPM Left Knee: On Left Knee Flexion (Degrees): 40 Left Knee Extension (Degrees): 10  Post Interventions Patient Tolerated: Well Instructions Provided: Care of device Ortho Devices Type of Ortho Device: CPM padding, Bone foam zero knee Ortho Device/Splint Interventions: Ordered, Application   Post Interventions Patient Tolerated: Well Instructions Provided: Care of device   Jennye Moccasin 01/25/2020, 12:05 PM

## 2020-01-25 NOTE — Evaluation (Signed)
Physical Therapy Evaluation Patient Details Name: Beverly Salazar MRN: [id number] DOB: [birth year] Today's Date: 01/25/2020   History of Present Illness  Patient is 54 y.o. female s/p Lt TKA on 01/25/20 with PMH significant for HTN, GERD, DVT, OA, anxiety, depression, anemia, Rt TKA 12/22/2018.    Clinical Impression  Beverly Salazar is a 54 y.o. female POD 0 s/p Lt TKA. Patient reports independence with [facility] for mobility at baseline. Patient is now limited by functional impairments (see PT problem list below) and requires min assist for transfers and gait with RW. Patient was able to ambulate ~7 feet with RW and min assist, pt limited by pain. Patient instructed in exercise to facilitate ROM and circulation. Patient will benefit from continued skilled PT interventions to address impairments and progress towards PLOF. Acute PT will follow to progress mobility and stair training in preparation for safe discharge home.     Follow Up Recommendations Follow surgeon's recommendation for DC plan and follow-up therapies;Home health PT    Equipment Recommendations  Rolling walker with 5" wheels;3in1 (PT)    Recommendations for Other Services       Precautions / Restrictions Precautions Precautions: Fall Restrictions Weight Bearing Restrictions: No LLE Weight Bearing: Weight bearing as tolerated      Mobility  Bed Mobility Overal bed mobility: Needs Assistance Bed Mobility: Supine to Sit     Supine to sit: Min assist;HOB elevated     General bed mobility comments: cues for sequencing and use of bed rail. assist for raising trunk and to bring Lt LE off EOB.  Transfers Overall transfer level: Needs assistance Equipment used: Rolling walker (2 wheeled) Transfers: Sit to/from Stand Sit to Stand: Min assist;From elevated surface         General transfer comment: cues for technique with RW and assist to rise and steady.  Ambulation/Gait Ambulation/Gait assistance: Min assist Gait  Distance (Feet): 7 Feet Assistive device: Rolling walker (2 wheeled) Gait Pattern/deviations: Step-to pattern;Decreased stride length;Decreased stance time - left;Decreased weight shift to left Gait velocity: decr   General Gait Details: VC's for step pattern and proximity to walker, assist for walker management/positioning.  Stairs       Wheelchair Mobility    Modified Rankin (Stroke Patients Only)       Balance Overall balance assessment: Needs assistance Sitting-balance support: Feet supported Sitting balance-Leahy Scale: Fair     Standing balance support: During functional activity;Bilateral upper extremity supported Standing balance-Leahy Scale: Poor          Pertinent Vitals/Pain Pain Assessment: Faces Faces Pain Scale: Hurts even more Pain Location: Lt knee Pain Descriptors / Indicators: Aching;Discomfort Pain Intervention(s): Limited activity within patient's tolerance;Monitored during session;Repositioned;Ice applied    Home Living Family/patient expects to be discharged to:: Private residence Living Arrangements: Spouse/significant other Available Help at Discharge: Family Type of Home: House Home Access: Stairs to enter Entrance Stairs-Rails: Can reach both Entrance Stairs-Number of Steps: 4 Home Layout: One level Home Equipment: Cane - single point      Prior Function Level of Independence: Independent with assistive device(s)         Comments: using SPC for ~4 yrs     Hand Dominance   Dominant Hand: Right    Extremity/Trunk Assessment   Upper Extremity Assessment Upper Extremity Assessment: Overall WFL for tasks assessed    Lower Extremity Assessment Lower Extremity Assessment: LLE deficits/detail LLE Deficits / Details: pt unable to complete SLR, limited quad activation in quad set due to pain. LLE:  Unable to fully assess due to pain;Unable to fully assess due to immobilization LLE Sensation: WNL    Cervical / Trunk  Assessment Cervical / Trunk Assessment: Normal  Communication   Communication: No difficulties  Cognition Arousal/Alertness: Awake/alert Behavior During Therapy: WFL for tasks assessed/performed Overall Cognitive Status: Within Functional Limits for tasks assessed         General Comments      Exercises Total Joint Exercises Ankle Circles/Pumps: AROM;Both;20 reps;Seated   Assessment/Plan    PT Assessment Patient needs continued PT services  PT Problem List Decreased strength;Decreased range of motion;Decreased activity tolerance;Decreased balance;Decreased mobility;Decreased knowledge of use of DME;Decreased knowledge of precautions;Pain       PT Treatment Interventions DME instruction;Gait training;Stair training;Functional mobility training;Therapeutic activities;Therapeutic exercise;Balance training;Patient/family education    PT Goals (Current goals can be found in the Care Plan section)  Acute Rehab PT Goals Patient Stated Goal: be able to get back to life with less pain PT Goal Formulation: With patient Time For Goal Achievement: 02/01/20 Potential to Achieve Goals: Good    Frequency 7X/week   Barriers to discharge           AM-PAC PT "6 Clicks" Mobility  Outcome Measure Help needed turning from your back to your side while in a flat bed without using bedrails?: A Little Help needed moving from lying on your back to sitting on the side of a flat bed without using bedrails?: A Little Help needed moving to and from a bed to a chair (including a wheelchair)?: A Little Help needed standing up from a chair using your arms (e.g., wheelchair or bedside chair)?: A Little Help needed to walk in hospital room?: A Little Help needed climbing 3-5 steps with a railing? : A Lot 6 Click Score: 17    End of Session Equipment Utilized During Treatment: Gait belt;Left knee immobilizer Activity Tolerance: Patient tolerated treatment well;Patient limited by pain Patient left:  in chair;with call bell/phone within reach;with chair alarm set;with family/visitor present Nurse Communication: Mobility status PT Visit Diagnosis: Muscle weakness (generalized) (M62.81);Difficulty in walking, not elsewhere classified (R26.2);Pain Pain - Right/Left: Left Pain - part of body: Knee    Time: 0626-9485 PT Time Calculation (min) (ACUTE ONLY): 21 min   Charges:   PT Evaluation $PT Eval Low Complexity: 1 Low         Wynn Maudlin, DPT Acute Rehabilitation Services  Office [phone] Pager [phone]  01/25/2020 4:59 PM

## 2020-01-25 NOTE — Progress Notes (Signed)
Orthopedic Tech Progress Note Patient Details:  Beverly Salazar Dec 25, 1965 [id number]  Ortho Devices Type of Ortho Device: CPM padding, Bone foam zero knee Ortho Device/Splint Interventions: Ordered, Application   Post Interventions Patient Tolerated: Well Instructions Provided: Care of device   Jennye Moccasin 01/25/2020, 3:19 PM

## 2020-01-25 NOTE — Transfer of Care (Signed)
Immediate Anesthesia Transfer of Care Note  Patient: Beverly Salazar  Procedure(s) Performed: LEFT TOTAL KNEE ARTHROPLASTY (Left Knee)  Patient Location: PACU  Anesthesia Type:General  Level of Consciousness: awake and patient cooperative  Airway & Oxygen Therapy: Patient Spontanous Breathing and Patient connected to face mask  Post-op Assessment: Report given to RN and Post -op Vital signs reviewed and stable  Post vital signs: Reviewed and stable  Last Vitals:  Vitals Value Taken Time  BP 132/75 01/25/20 1015  Temp    Pulse 79 01/25/20 1017  Resp 18 01/25/20 1017  SpO2 100 % 01/25/20 1017  Vitals shown include unvalidated device data.  Last Pain:  Vitals:   01/25/20 0614  TempSrc: Oral  PainSc:       Patients Stated Pain Goal: 3 (01/25/20 0606)  Complications: No complications documented.

## 2020-01-26 DIAGNOSIS — M1712 Unilateral primary osteoarthritis, left knee: Secondary | ICD-10-CM | POA: Diagnosis not present

## 2020-01-26 NOTE — Discharge Instructions (Signed)
Information on my medicine - XARELTO (Rivaroxaban)  This medication education was reviewed with me or my healthcare representative as part of my discharge preparation.  The pharmacist that spoke with me during my hospital stay was:  Jameon Deller, Student-PharmD  Why was Xarelto prescribed for you? Xarelto was prescribed for you to reduce the risk of blood clots forming after orthopedic surgery. The medical term for these abnormal blood clots is venous thromboembolism (VTE).  What do you need to know about xarelto ? Take your Xarelto ONCE DAILY at the same time every day. You may take it either with or without food.  If you have difficulty swallowing the tablet whole, you may crush it and mix in applesauce just prior to taking your dose.  Take Xarelto exactly as prescribed by your doctor and DO NOT stop taking Xarelto without talking to the doctor who prescribed the medication.  Stopping without other VTE prevention medication to take the place of Xarelto may increase your risk of developing a clot.  After discharge, you should have regular check-up appointments with your healthcare provider that is prescribing your Xarelto.    What do you do if you miss a dose? If you miss a dose, take it as soon as you remember on the same day then continue your regularly scheduled once daily regimen the next day. Do not take two doses of Xarelto on the same day.   Important Safety Information A possible side effect of Xarelto is bleeding. You should call your healthcare provider right away if you experience any of the following: ? Bleeding from an injury or your nose that does not stop. ? Unusual colored urine (red or dark brown) or unusual colored stools (red or black). ? Unusual bruising for unknown reasons. ? A serious fall or if you hit your head (even if there is no bleeding).  Some medicines may interact with Xarelto and might increase your risk of bleeding while on Xarelto. To  help avoid this, consult your healthcare provider or pharmacist prior to using any new prescription or non-prescription medications, including herbals, vitamins, non-steroidal anti-inflammatory drugs (NSAIDs) and supplements.  This website has more information on Xarelto: www.xarelto.com.   

## 2020-01-26 NOTE — Progress Notes (Signed)
Orthopedic Tech Progress Note Patient Details:  Beverly Salazar 06/02/1966 [id number]  CPM Left Knee CPM Left Knee: Off Left Knee Flexion (Degrees): 40 Left Knee Extension (Degrees): 10 Additional Comments: off cpm at 9:30  Post Interventions Patient Tolerated: Well Instructions Provided: Care of device  Jennye Moccasin 01/26/2020, 9:30 PM

## 2020-01-26 NOTE — Progress Notes (Signed)
°  Subjective: Beverly Salazar is a 54 y.o. female s/p left TKA.  They are POD 1.  Pt's pain is controlled.  Pt denies numbness/tingling/weakness.  Pt has ambulated with some difficulty.     Objective: Vital signs in last 24 hours: Temp:  [97.6 F (36.4 C)-98.6 F (37 C)] 98.5 F (36.9 C) (08/18 0555) Pulse Rate:  [69-97] 90 (08/18 0941) Resp:  [12-20] 18 (08/18 0941) BP: (94-132)/(46-80) 113/54 (08/18 0941) SpO2:  [94 %-100 %] 100 % (08/18 0941)  Intake/Output from previous day: 08/17 0701 - 08/18 0700 In: 3253.7 [P.O.:720; I.V.:2133.7; IV Piggyback:400] Out: 1000 [Urine:900; Blood:100] Intake/Output this shift: Total I/O In: -  Out: 300 [Urine:300]  Exam:  No gross blood or drainage overlying the dressing 2+ DP pulse Sensation intact distally in the left foot Able to dorsiflex and plantarflex the left foot   Labs: No results for input(s): HGB in the last 72 hours. No results for input(s): WBC, RBC, HCT, PLT in the last 72 hours. No results for input(s): NA, K, CL, CO2, BUN, CREATININE, GLUCOSE, CALCIUM in the last 72 hours. No results for input(s): LABPT, INR in the last 72 hours.  Assessment/Plan: Pt is POD 1 s/p left TKA.    -Plan to discharge to home today or tomorrow pending patient's pain and PT eval. her preference is tomorrow  -WBAT with a walker     Beverly Salazar Beverly Salazar 01/26/2020, 10:06 AM

## 2020-01-26 NOTE — Progress Notes (Signed)
Orthopedic Tech Progress Note Patient Details:  Beverly Salazar 14-Feb-1966 [id number]  CPM Left Knee CPM Left Knee: On Left Knee Flexion (Degrees): 40 Left Knee Extension (Degrees): 10 Additional Comments: off cpm at 3:15  Post Interventions Patient Tolerated: Well Instructions Provided: Care of device  Saul Fordyce 01/26/2020, 8:51 AM

## 2020-01-26 NOTE — Progress Notes (Signed)
Physical Therapy Treatment Patient Details Name: Beverly Salazar MRN: [id number] DOB: [birth year] Today's Date: 01/26/2020    History of Present Illness Patient is 54 y.o. female s/p Lt TKA on 01/25/20 with PMH significant for HTN, GERD, DVT, OA, anxiety, depression, anemia, Rt TKA 12/22/2018.    PT Comments    Pt with high pain limiting session. Pt with weak L quad activation noted with quad set, improved ability when performing R and L together simultaneously. Pt requires assist with abd/add due to weakness. Pt able to stand pivot to [facility] to void bladder, therapist provided pericare, then pivoted over to chair. Pt limited to short, slow, shuffling, at times sliding, steps at bedside, heavy use of BUE on RW, no L knee buckling noted but also decreased weight-shift to LLE due to pain. Pt positioned in chair with BLE elevated, ice on knee, chair alarm on and call bell in lap. Pt requests pillow under calf/foot, so therapist placed pillow and educated pt to NOT slide pillow up under knee and pt verbalized understanding. Patient will benefit from continued physical therapy in hospital and recommendations below to increase strength, balance, endurance for safe ADLs and gait.   Follow Up Recommendations  Follow surgeon's recommendation for DC plan and follow-up therapies;Home health PT     Equipment Recommendations  Rolling walker with 5" wheels;3in1 (PT)    Recommendations for Other Services       Precautions / Restrictions Precautions Precautions: Fall Restrictions Weight Bearing Restrictions: No LLE Weight Bearing: Weight bearing as tolerated    Mobility  Bed Mobility Overal bed mobility: Needs Assistance Bed Mobility: Supine to Sit  Supine to sit: Min assist;HOB elevated     General bed mobility comments: min A to mobilize LLE off of bed, able to upright trunk and scoot to EOB with use of bedrail, increased time due to pain  Transfers Overall transfer level: Needs  assistance Equipment used: Rolling walker (2 wheeled) Transfers: Sit to/from [employer] Sit to Stand: Min guard;Min assist Stand pivot transfers: Min guard  General transfer comment: min G from elevated surface, min A from [facility] but able to push up from armrests to rise, min G to pivot over to [facility] and chair with cues for sequencing and controlled descent  Ambulation/Gait Ambulation/Gait assistance: Min assist   Assistive device: Rolling walker (2 wheeled)  General Gait Details: pt only tolerates short, slow, shuffling steps at bedside, at times sliding L and R foot along floor due to pain and weakness; no L knee buckling noted, but also decreased weight-shift to LLE and heavy use of BUE on RW   Stairs             Wheelchair Mobility    Modified Rankin (Stroke Patients Only)       Balance Overall balance assessment: Needs assistance Sitting-balance support: Feet supported Sitting balance-Leahy Scale: Fair Sitting balance - Comments: seated EOB   Standing balance support: During functional activity;Bilateral upper extremity supported Standing balance-Leahy Scale: Poor Standing balance comment: reliant on BUE on RW           Cognition Arousal/Alertness: Awake/alert Behavior During Therapy: WFL for tasks assessed/performed Overall Cognitive Status: Within Functional Limits for tasks assessed         Exercises Total Joint Exercises Ankle Circles/Pumps: AROM;Both;20 reps;Supine Quad Sets: AROM;Both;10 reps;Supine Hip ABduction/ADduction: AAROM;Left;10 reps;Supine    General Comments        Pertinent Vitals/Pain Pain Assessment: 0-10 Pain Score: 9  Pain Location: L knee Pain  Descriptors / Indicators: Aching;Grimacing;Guarding;Sore;Discomfort Pain Intervention(s): Limited activity within patient's tolerance;Monitored during session;Premedicated before session;Repositioned;Ice applied    Home Living                      Prior Function             PT Goals (current goals can now be found in the care plan section) Acute Rehab PT Goals Patient Stated Goal: be able to get back to life with less pain PT Goal Formulation: With patient Time For Goal Achievement: 02/01/20 Potential to Achieve Goals: Good Progress towards PT goals: Progressing toward goals    Frequency    7X/week      PT Plan Current plan remains appropriate    Co-evaluation              AM-PAC PT "6 Clicks" Mobility   Outcome Measure  Help needed turning from your back to your side while in a flat bed without using bedrails?: A Little Help needed moving from lying on your back to sitting on the side of a flat bed without using bedrails?: A Little Help needed moving to and from a bed to a chair (including a wheelchair)?: A Little Help needed standing up from a chair using your arms (e.g., wheelchair or bedside chair)?: A Little Help needed to walk in hospital room?: A Lot Help needed climbing 3-5 steps with a railing? : A Lot 6 Click Score: 16    End of Session Equipment Utilized During Treatment: Gait belt Activity Tolerance: Patient tolerated treatment well;Patient limited by pain Patient left: in chair;with call bell/phone within reach;with chair alarm set Nurse Communication: Mobility status PT Visit Diagnosis: Muscle weakness (generalized) (M62.81);Difficulty in walking, not elsewhere classified (R26.2);Pain Pain - Right/Left: Left Pain - part of body: Knee     Time: 5397-6734 PT Time Calculation (min) (ACUTE ONLY): 34 min  Charges:  $Therapeutic Exercise: 8-22 mins $Therapeutic Activity: 8-22 mins                      Tori Hamp Moreland PT, DPT 01/26/20, 11:45 AM

## 2020-01-26 NOTE — Progress Notes (Signed)
Orthopedic Tech Progress Note Patient Details:  Beverly Salazar 1966-02-21 [id number]  CPM Left Knee CPM Left Knee: Off Left Knee Flexion (Degrees): 40 Left Knee Extension (Degrees): 10 Additional Comments: off cpm at 3:15  Post Interventions Patient Tolerated: Well Instructions Provided: Care of device  Saul Fordyce 01/26/2020, 10:14 AM

## 2020-01-26 NOTE — Progress Notes (Signed)
Physical Therapy Treatment Patient Details Name: Beverly Salazar MRN: [id number] DOB: 20-Jun-1965 Today's Date: 01/26/2020    History of Present Illness Patient is 54 y.o. female s/p Lt TKA on 01/25/20 with PMH significant for HTN, GERD, DVT, OA, anxiety, depression, anemia, Rt TKA 12/22/2018.    PT Comments    Pt progressing, some limitations d/t pain however improved from am. incr gait distance/tolerance and less assist overall this pm. Continue PT POC in acute setting    Follow Up Recommendations  Follow surgeon's recommendation for DC plan and follow-up therapies;Home health PT     Equipment Recommendations  Rolling walker with 5" wheels;3in1 (PT)    Recommendations for Other Services       Precautions / Restrictions Precautions Precautions: Knee Required Braces or Orthoses: Knee Immobilizer - Left Restrictions Weight Bearing Restrictions: No Other Position/Activity Restrictions: WBAT    Mobility  Bed Mobility Overal bed mobility: Needs Assistance Bed Mobility: Supine to Sit;Sit to Supine     Supine to sit: Min guard Sit to supine: Min assist   General bed mobility comments: min/guard to come to sit, using gait belt as leg lifter. min assist to lift LLE when returning to supine, incr time   Transfers Overall transfer level: Needs assistance Equipment used: Rolling walker (2 wheeled) Transfers: Sit to/from Stand Sit to Stand: Supervision         General transfer comment: cues for hand placement and LLE position   Ambulation/Gait Ambulation/Gait assistance: Min guard Gait Distance (Feet): 30 Feet Assistive device: Rolling walker (2 wheeled) Gait Pattern/deviations: Step-to pattern;Decreased stance time - left Gait velocity: decr   General Gait Details: cues for sequence and RW position    Stairs             Wheelchair Mobility    Modified Rankin (Stroke Patients Only)       Balance     Sitting balance-Leahy Scale: Fair       Standing  balance-Leahy Scale: Poor Standing balance comment: reliant on BUE on RW                            Cognition Arousal/Alertness: Awake/alert Behavior During Therapy: WFL for tasks assessed/performed Overall Cognitive Status: Within Functional Limits for tasks assessed                                        Exercises Total Joint Exercises Ankle Circles/Pumps: AROM;Both;20 reps;Supine Heel Slides: Limitations Heel Slides Limitations: pain    General Comments        Pertinent Vitals/Pain Pain Assessment: 0-10 Pain Score: 6  Pain Location: L knee Pain Descriptors / Indicators: Grimacing;Sore Pain Intervention(s): Limited activity within patient's tolerance;Monitored during session;Premedicated before session;Repositioned;Ice applied    Home Living                      Prior Function            PT Goals (current goals can now be found in the care plan section) Acute Rehab PT Goals Patient Stated Goal: be able to get back to life with less pain PT Goal Formulation: With patient Time For Goal Achievement: 02/01/20 Potential to Achieve Goals: Good Progress towards PT goals: Progressing toward goals    Frequency    7X/week      PT Plan Current plan remains appropriate  Co-evaluation              AM-PAC PT "6 Clicks" Mobility   Outcome Measure  Help needed turning from your back to your side while in a flat bed without using bedrails?: A Little Help needed moving from lying on your back to sitting on the side of a flat bed without using bedrails?: A Little Help needed moving to and from a bed to a chair (including a wheelchair)?: A Little Help needed standing up from a chair using your arms (e.g., wheelchair or bedside chair)?: A Little Help needed to walk in hospital room?: A Little Help needed climbing 3-5 steps with a railing? : A Lot 6 Click Score: 17    End of Session Equipment Utilized During Treatment: Left  knee immobilizer;Gait belt Activity Tolerance: Patient tolerated treatment well (HEP ltd by pain) Patient left: in bed;with bed alarm set;with family/visitor present;with call bell/phone within reach   PT Visit Diagnosis: Muscle weakness (generalized) (M62.81);Difficulty in walking, not elsewhere classified (R26.2);Pain Pain - Right/Left: Left Pain - part of body: Knee     Time: 0539-7673 PT Time Calculation (min) (ACUTE ONLY): 18 min  Charges:  $Gait Training: 8-22 mins                     Delice Bison, PT  Acute Rehab Dept ([facility]) [phone] Pager [phone]  01/26/2020    [facility] 01/26/2020, 5:03 PM

## 2020-01-26 NOTE — Progress Notes (Signed)
Orthopedic Tech Progress Note Patient Details:  Beverly Salazar September 08, 1965 [id number]  CPM Left Knee CPM Left Knee: Off Left Knee Flexion (Degrees): 40 Left Knee Extension (Degrees): 10 Additional Comments: on cpm at 7:30  Post Interventions Patient Tolerated: Well Instructions Provided: Care of device  Jennye Moccasin 01/26/2020, 7:37 PM

## 2020-01-26 NOTE — TOC Initial Note (Signed)
Transition of Care [facility]) - Initial/Assessment Note    Patient Details  Name: Beverly Salazar MRN: [id number] Date of Birth: 11-03-1965  Transition of Care [facility]) CM/SW Contact:    Lennart Pall, LCSW Phone Number: 01/26/2020, 1:41 PM  Clinical Narrative:                 Met with pt to review potential d/c needs.  Pt had prior R-TKA ~ one year ago, however, no longer has her walker and bedside commode.  She does have good support at home from family.   As she is a workers comp case, I have faxed orders for rolling walker, 3n1 commode and HHPT in to [employer] @ [phone].  They will be in touch with pt about delivery of DME and start of [place].  She understands this and in agreement.  Will continue to follow.  Expected Discharge Plan: [place] Barriers to Discharge: Continued Medical Work up   Patient Goals and CMS Choice Patient states their goals for this hospitalization and ongoing recovery are:: return home      Expected Discharge Plan and Services Expected Discharge Plan: [place] In-house Referral: Clinical Social Work     Living arrangements for the past 2 months: Single Family Home                 DME Arranged: 3-N-1, Walker rolling   Date DME Agency Contacted: 01/26/20 Time DME Agency Contacted: 55 Representative spoke with at DME Agency: faxed to [employer] for Workers Comp [place]: PT   Date [place]: 01/26/20 Time [place]: 33 Representative spoke with at [place]: faxed to [employer] for Workers Comp  Prior Living Arrangements/Services Living arrangements for the past 2 months: [place] with:: Spouse Patient language and need for interpreter reviewed:: No Do you feel safe going back to the place where you live?: Yes      Need for Family Participation in Patient Care: Yes (Comment) Care giver support system in place?: Yes (comment)   Criminal Activity/Legal Involvement Pertinent to  Current Situation/Hospitalization: No - Comment as needed  Activities of Daily Living Home Assistive Devices/Equipment: Eyeglasses, Cane (specify quad or straight) ADL Screening (condition at time of admission) Patient's cognitive ability adequate to safely complete daily activities?: Yes Is the patient deaf or have difficulty hearing?: No Does the patient have difficulty seeing, even when wearing glasses/contacts?: No Does the patient have difficulty concentrating, remembering, or making decisions?: No Patient able to express need for assistance with ADLs?: Yes Does the patient have difficulty dressing or bathing?: No Independently performs ADLs?: Yes (appropriate for developmental age) Does the patient have difficulty walking or climbing stairs?: Yes Weakness of Legs: Both Weakness of Arms/Hands: None  Permission Sought/Granted Permission sought to share information with : Other (comment) ([employer] case management for Workers Comp claims) Permission granted to share information with : Yes, Verbal Permission Granted              Emotional Assessment Appearance:: Appears stated age Attitude/Demeanor/Rapport: Engaged, Gracious Affect (typically observed): Accepting, Pleasant Orientation: : Oriented to Self, Oriented to Place, Oriented to  Time, Oriented to Situation Alcohol / Substance Use: Not Applicable Psych Involvement: No (comment)  Admission diagnosis:  S/P total knee arthroplasty, left [Z96.652] Patient Active Problem List   Diagnosis Date Noted  . S/P total knee arthroplasty, left 01/25/2020  . Arthritis of knee 12/22/2018  . Unilateral primary osteoarthritis,  right knee 02/05/2017   PCP:  Burman Freestone, MD Pharmacy:   [facility] [phone] - [place], [place] AT [address] [place] [address] Phone: [phone] Fax: [phone]     Social Determinants of Health (SDOH) Interventions    Readmission Risk  Interventions No flowsheet data found.

## 2020-01-27 ENCOUNTER — Encounter (HOSPITAL_COMMUNITY): Payer: Self-pay | Admitting: Orthopedic Surgery

## 2020-01-27 DIAGNOSIS — M1712 Unilateral primary osteoarthritis, left knee: Secondary | ICD-10-CM | POA: Diagnosis not present

## 2020-01-27 MED ORDER — OXYCODONE HCL 5 MG PO TABS: 5.0000 mg | ORAL_TABLET | ORAL | 0 refills | Status: DC | PRN

## 2020-01-27 MED ORDER — CELECOXIB 200 MG PO CAPS: 200.0000 mg | ORAL_CAPSULE | Freq: Two times a day (BID) | ORAL | 0 refills | Status: DC

## 2020-01-27 NOTE — Progress Notes (Signed)
Orthopedic Tech Progress Note Patient Details:  Beverly Salazar 1965-07-25 [id number]  CPM Left Knee CPM Left Knee: On Left Knee Flexion (Degrees): 40 Left Knee Extension (Degrees): 10 Additional Comments: off cpm at 9:30  Post Interventions Patient Tolerated: Well Instructions Provided: Care of device  Saul Fordyce 01/27/2020, 6:10 PM

## 2020-01-27 NOTE — Progress Notes (Signed)
Physical Therapy Treatment Patient Details Name: Beverly Salazar MRN: [id number] DOB: [birth year] Today's Date: 01/27/2020    History of Present Illness Patient is 54 y.o. female s/p Lt TKA on 01/25/20 with PMH significant for HTN, GERD, DVT, OA, anxiety, depression, anemia, Rt TKA 12/22/2018.    PT Comments    POD # 2 am session Assisted OOB to amb to bathroom then in hallway and also practiced 2 stair/steps.  Pt will need another PT session to practice stairs again and instruct on HEP.   Follow Up Recommendations  Follow surgeon's recommendation for DC plan and follow-up therapies;Home health PT     Equipment Recommendations  Rolling walker with 5" wheels;3in1 (PT)    Recommendations for Other Services       Precautions / Restrictions Precautions Precautions: Knee Precaution Comments: instructed no pillow under knee Restrictions Weight Bearing Restrictions: No LLE Weight Bearing: Weight bearing as tolerated    Mobility  Bed Mobility Overal bed mobility: Needs Assistance Bed Mobility: Supine to Sit;Sit to Supine     Supine to sit: Supervision;Min guard Sit to supine: Supervision;Min guard   General bed mobility comments: increased time and 25% VC's on proper hand placement and assist to support LE  Transfers Overall transfer level: Needs assistance Equipment used: Rolling walker (2 wheeled) Transfers: Sit to/from Stand Sit to Stand: Supervision Stand pivot transfers: Min guard       General transfer comment: increased time and 25% VC's on proper hand placement    Also assisted with a toilet transfer  Ambulation/Gait Ambulation/Gait assistance: Supervision Gait Distance (Feet): 42 Feet Assistive device: Rolling walker (2 wheeled) Gait Pattern/deviations: Step-to pattern;Decreased stance time - left Gait velocity: decr   General Gait Details: increased time and <25% VC's on safety with turns   Stairs Stairs: Yes Stairs assistance: Min guard Stair  Management: Two rails;Forwards Number of Stairs: 2 General stair comments: 50% VC's on proper sequencing and proper tech   Wheelchair Mobility    Modified Rankin (Stroke Patients Only)       Balance                                            Cognition Arousal/Alertness: Awake/alert Behavior During Therapy: WFL for tasks assessed/performed Overall Cognitive Status: Within Functional Limits for tasks assessed                                        Exercises      General Comments        Pertinent Vitals/Pain Pain Assessment: 0-10 Pain Score: 7  Pain Location: L knee Pain Descriptors / Indicators: Grimacing;Sore;Tightness;Tender;Discomfort Pain Intervention(s): Monitored during session;Repositioned;Ice applied    Home Living                      Prior Function            PT Goals (current goals can now be found in the care plan section) Progress towards PT goals: Progressing toward goals    Frequency    7X/week      PT Plan Current plan remains appropriate    Co-evaluation              AM-PAC PT "6 Clicks" Mobility   Outcome Measure  Help needed turning  from your back to your side while in a flat bed without using bedrails?: A Little Help needed moving from lying on your back to sitting on the side of a flat bed without using bedrails?: A Little Help needed moving to and from a bed to a chair (including a wheelchair)?: A Little Help needed standing up from a chair using your arms (e.g., wheelchair or bedside chair)?: A Little Help needed to walk in hospital room?: A Little Help needed climbing 3-5 steps with a railing? : A Little 6 Click Score: 18    End of Session Equipment Utilized During Treatment: Gait belt Activity Tolerance: Patient tolerated treatment well Patient left: in bed;with bed alarm set;with call bell/phone within reach Nurse Communication: Mobility status PT Visit Diagnosis: Muscle  weakness (generalized) (M62.81);Difficulty in walking, not elsewhere classified (R26.2);Pain Pain - Right/Left: Left Pain - part of body: Knee     Time: 0900-0930 PT Time Calculation (min) (ACUTE ONLY): 30 min  Charges:  $Gait Training: 8-22 mins $Therapeutic Activity: 8-22 mins                     Felecia Shelling  PTA Acute  Rehabilitation Services Pager      [phone] Office      [phone]

## 2020-01-27 NOTE — Progress Notes (Signed)
Orthopedic Tech Progress Note Patient Details:  Beverly Salazar Apr 21, 1966 [id number]  CPM Left Knee CPM Left Knee: Off Left Knee Flexion (Degrees): 40 Left Knee Extension (Degrees): 10 Additional Comments: off cpm at 9:30  Post Interventions Patient Tolerated: Well Instructions Provided: Care of device  Saul Fordyce 01/27/2020, 6:46 PM

## 2020-01-27 NOTE — Progress Notes (Signed)
  Subjective: Patient stable.  Pain well controlled.  Has not done stairs yet but plans to today.   Objective: Vital signs in last 24 hours: Temp:  [97.9 F (36.6 C)-98.4 F (36.9 C)] 98.3 F (36.8 C) (08/19 0506) Pulse Rate:  [86-110] 101 (08/19 0506) Resp:  [16-18] 16 (08/19 0506) BP: (113-150)/(54-91) 150/91 (08/19 0506) SpO2:  [99 %-100 %] 100 % (08/19 0506)  Intake/Output from previous day: 08/18 0701 - 08/19 0700 In: 480 [P.O.:480] Out: 700 [Urine:700] Intake/Output this shift: No intake/output data recorded.  Exam:  Neurologically intact  Labs: No results for input(s): HGB in the last 72 hours. No results for input(s): WBC, RBC, HCT, PLT in the last 72 hours. No results for input(s): NA, K, CL, CO2, BUN, CREATININE, GLUCOSE, CALCIUM in the last 72 hours. No results for input(s): LABPT, INR in the last 72 hours.  Assessment/Plan: Plan at this time is physical therapy this morning and physical therapy this afternoon with discharge this afternoon.  Patient wants to feel a little bit more comfortable with stairs before she discharges to home.   Beverly Salazar Gangl 01/27/2020, 7:47 AM

## 2020-01-27 NOTE — Progress Notes (Signed)
Orthopedic Tech Progress Note °Patient Details:  °Beverly Salazar °10/11/1965 °[id number] ° °CPM Left Knee °CPM Left Knee: Off °Left Knee Flexion (Degrees): 40 °Left Knee Extension (Degrees): 10 °Additional Comments: off cpm at 9:30 ° °Post Interventions °Patient Tolerated: Well °Instructions Provided: Care of device ° °[name] C Lacrystal Barbe °01/27/2020, 6:09 PM ° °

## 2020-01-27 NOTE — Plan of Care (Signed)
  Problem: Education: Goal: Knowledge of the prescribed therapeutic regimen will improve Outcome: Progressing   Problem: Activity: Goal: Range of joint motion will improve Outcome: Progressing   Problem: Pain Management: Goal: Pain level will decrease with appropriate interventions Outcome: Progressing   

## 2020-01-27 NOTE — Progress Notes (Signed)
Orthopedic Tech Progress Note Patient Details:  Beverly Salazar May 25, 1966 [id number]  CPM Left Knee CPM Left Knee: Off Left Knee Flexion (Degrees): 40 Left Knee Extension (Degrees): 10 Additional Comments: off cpm at 9:30  Post Interventions Patient Tolerated: Well Instructions Provided: Care of device  Saul Fordyce 01/27/2020, 6:09 PM

## 2020-01-27 NOTE — TOC Transition Note (Signed)
Transition of Care [facility]) - CM/SW Discharge Note   Patient Details  Name: Beverly Salazar MRN: [id number] Date of Birth: 1965-08-28  [facility]) CM/SW Contact:  Lennart Pall, LCSW Phone Number: 01/27/2020, 11:37 AM   Clinical Narrative:    Met briefly with pt this morning and she reports she has received calls regarding set up/ delivery of her DME.  I did confirm with [employer] again today that they received our orders for DME and [facility] and that pt should be receiving calls from agencies who will provide services.  Pt ready for dc today.   Final next level of care: [place] Barriers to Discharge: Barriers Resolved   Patient Goals and CMS Choice Patient states their goals for this hospitalization and ongoing recovery are:: return home      Discharge Placement                       Discharge Plan and Services In-house Referral: Clinical Social Work              DME Arranged: 3-N-1, Walker rolling   Date DME Agency Contacted: 01/26/20 Time DME Agency Contacted: 23 Representative spoke with at DME Agency: faxed to [employer] for Workers Comp [place]: PT   Date [place]: 01/26/20 Time [place]: 35 Representative spoke with at [place]: faxed to [employer] for Workers Comp  Social Determinants of Health ([place]) Interventions     Readmission Risk Interventions No flowsheet data found.

## 2020-01-27 NOTE — Progress Notes (Signed)
Physical Therapy Treatment Patient Details Name: Beverly Salazar MRN: [id number] DOB: 04-Sep-1965 Today's Date: 01/27/2020    History of Present Illness Patient is 54 y.o. female s/p Lt TKA on 01/25/20 with PMH significant for HTN, GERD, DVT, OA, anxiety, depression, anemia, Rt TKA 12/22/2018.    PT Comments    POD # 2 pm session Assisted OOB to amb an increased distance in hallway, practiced stairs Then returned to room to perform some TE's following HEP handout.  Instructed on proper tech, freq as well as use of ICE.  Addressed all mobility questions, discussed appropriate activity, educated on use of ICE.  Pt ready for D/C to home.    Follow Up Recommendations  Follow surgeon's recommendation for DC plan and follow-up therapies;Home health PT     Equipment Recommendations  Rolling walker with 5" wheels;3in1 (PT)    Recommendations for Other Services       Precautions / Restrictions Precautions Precautions: Knee Precaution Comments: instructed no pillow under knee Restrictions Weight Bearing Restrictions: No LLE Weight Bearing: Weight bearing as tolerated    Mobility  Bed Mobility Overal bed mobility: Needs Assistance Bed Mobility: Supine to Sit;Sit to Supine     Supine to sit: Supervision;Min guard Sit to supine: Supervision;Min guard   General bed mobility comments: increased time and 25% VC's on proper hand placement and assist to support LE    Pt using belt to self assist LE  Transfers Overall transfer level: Needs assistance Equipment used: Rolling walker (2 wheeled) Transfers: Sit to/from Stand Sit to Stand: Supervision Stand pivot transfers: Supervision       General transfer comment: increased time and 25% VC's on proper hand placement  Ambulation/Gait Ambulation/Gait assistance: Supervision Gait Distance (Feet): 63 Feet Assistive device: Rolling walker (2 wheeled) Gait Pattern/deviations: Step-to pattern;Decreased stance time - left Gait velocity:  decr   General Gait Details: increased time and <25% VC's on safety with turns   Stairs Stairs: Yes Stairs assistance: Supervision;Min guard Stair Management: Two rails;Forwards Number of Stairs: 2 General stair comments: 25% VC's on proper sequencing and proper tech plus increased time   Wheelchair Mobility    Modified Rankin (Stroke Patients Only)       Balance                                            Cognition Arousal/Alertness: Awake/alert Behavior During Therapy: WFL for tasks assessed/performed Overall Cognitive Status: Within Functional Limits for tasks assessed                                        Exercises   Total Knee Replacement TE's following HEP handout 10 reps B LE ankle pumps 05 reps towel squeezes 05 reps knee presses 05 reps heel slides  05 reps SAQ's 05 reps SLR's 05 reps ABD Educated on use of gait belt to assist with TE's Followed by ICE    General Comments        Pertinent Vitals/Pain Pain Assessment: 0-10 Pain Score: 5  Pain Location: L knee Pain Descriptors / Indicators: Grimacing;Sore;Tightness;Tender;Discomfort Pain Intervention(s): Monitored during session;Premedicated before session;Ice applied    Home Living                      Prior Function  PT Goals (current goals can now be found in the care plan section) Progress towards PT goals: Progressing toward goals    Frequency    7X/week      PT Plan Current plan remains appropriate    Co-evaluation              AM-PAC PT "6 Clicks" Mobility   Outcome Measure  Help needed turning from your back to your side while in a flat bed without using bedrails?: A Little Help needed moving from lying on your back to sitting on the side of a flat bed without using bedrails?: A Little Help needed moving to and from a bed to a chair (including a wheelchair)?: A Little Help needed standing up from a chair using  your arms (e.g., wheelchair or bedside chair)?: A Little Help needed to walk in hospital room?: A Little Help needed climbing 3-5 steps with a railing? : A Little 6 Click Score: 18    End of Session Equipment Utilized During Treatment: Gait belt Activity Tolerance: Patient tolerated treatment well Patient left: in bed;with bed alarm set;with call bell/phone within reach Nurse Communication: Mobility status (pt has met goals to D/C to home today) PT Visit Diagnosis: Muscle weakness (generalized) (M62.81);Difficulty in walking, not elsewhere classified (R26.2);Pain Pain - Right/Left: Left Pain - part of body: Knee     Time: 7290-2111 PT Time Calculation (min) (ACUTE ONLY): 30 min  Charges:  $Gait Training: 8-22 mins $Therapeutic Exercise: 8-22 mins                     Rica Koyanagi  PTA Acute  Rehabilitation Services Pager      [phone] Office      [phone]

## 2020-01-28 DIAGNOSIS — M1712 Unilateral primary osteoarthritis, left knee: Secondary | ICD-10-CM | POA: Diagnosis not present

## 2020-01-28 NOTE — Progress Notes (Signed)
Patient is a 54 year old female who is postop day 3 s/p left total knee arthroplasty.  She is doing well overall.  She has been increasing her ambulation day by day.  Pain is well controlled.  She states that she is ready for discharge home.  She is just awaiting delivery of home DME.  Dressings intact without gross blood or drainage.  Left foot warm and well-perfused.  Dorsiflexion/plantarflexion intact.  Plan for discharge home when patient's home DME including walker is delivered to her house.  Patient does have a CPM machine at home.  Plan for patient to follow-up with Dr. August Saucer 2 weeks postop.

## 2020-01-28 NOTE — Progress Notes (Signed)
Orthopedic Tech Progress Note Patient Details:  Beverly Salazar Sep 15, 1965 [id number]  CPM Left Knee CPM Left Knee: Off Left Knee Flexion (Degrees): 40 Left Knee Extension (Degrees): 10 Additional Comments: off cpm at 9:30  Post Interventions Patient Tolerated: Well Instructions Provided: Care of device  Saul Fordyce 01/28/2020, 8:24 AM

## 2020-01-28 NOTE — Progress Notes (Signed)
Physical Therapy Treatment Patient Details Name: Beverly Salazar MRN: [id number] DOB: 1966/06/06 Today's Date: 01/28/2020    History of Present Illness Patient is 54 y.o. female s/p Lt TKA on 01/25/20 with PMH significant for HTN, GERD, DVT, OA, anxiety, depression, anemia, Rt TKA 12/22/2018.    PT Comments    POD #3 Assisted OOB to amb to bathroom then in hallway.  Repeated stair training Then returned to room to perform some TE's following HEP handout.  Instructed on proper tech, freq as well as use of ICE.   Pt is ready for dc to home   Follow Up Recommendations  Follow surgeons recommendation for DC plan and follow-up therapies;Home health PT     Equipment Recommendations  Rolling walker with 5" wheels;3in1 (PT)    Recommendations for Other Services       Precautions / Restrictions Precautions Precautions: Knee Precaution Comments: instructed no pillow under knee Restrictions Weight Bearing Restrictions: No LLE Weight Bearing: Weight bearing as tolerated    Mobility  Bed Mobility Overal bed mobility: Needs Assistance Bed Mobility: Supine to Sit;Sit to Supine     Supine to sit: Supervision Sit to supine: Supervision   General bed mobility comments: pt using belt loop to self assist LE at Supervision level  Transfers Overall transfer level: Needs assistance Equipment used: Rolling walker (2 wheeled) Transfers: Sit to/from [employer] Transfers Sit to Stand: Supervision Stand pivot transfers: Supervision       General transfer comment: increased time and <25% VC's on proper hand placement    also assisted with a toilet transfer  Ambulation/Gait Ambulation/Gait assistance: Supervision Gait Distance (Feet): 75 Feet Assistive device: Rolling walker (2 wheeled) Gait Pattern/deviations: Step-to pattern;Decreased stance time - left Gait velocity: decr   General Gait Details: increased time and <25% VC's on safety with turns   Stairs Stairs:  Yes Stairs assistance: Supervision Stair Management: Two rails Number of Stairs: 2 General stair comments: <25% VC's on proper sequencing and proper tech plus increased time   Wheelchair Mobility    Modified Rankin (Stroke Patients Only)       Balance                                            Cognition Arousal/Alertness: Awake/alert Behavior During Therapy: WFL for tasks assessed/performed Overall Cognitive Status: Within Functional Limits for tasks assessed                                 General Comments: AxO x 4      Exercises      General Comments        Pertinent Vitals/Pain Pain Assessment: 0-10 Pain Score: 7  Pain Location: L knee Pain Descriptors / Indicators: Grimacing;Sore;Tightness;Tender;Discomfort Pain Intervention(s): Monitored during session;Premedicated before session;Repositioned;Ice applied    Home Living                      Prior Function            PT Goals (current goals can now be found in the care plan section) Progress towards PT goals: Progressing toward goals    Frequency    7X/week      PT Plan Current plan remains appropriate    Co-evaluation  AM-PAC PT "6 Clicks" Mobility   Outcome Measure  Help needed turning from your back to your side while in a flat bed without using bedrails?: None Help needed moving from lying on your back to sitting on the side of a flat bed without using bedrails?: None Help needed moving to and from a bed to a chair (including a wheelchair)?: None Help needed standing up from a chair using your arms (e.g., wheelchair or bedside chair)?: None Help needed to walk in hospital room?: None Help needed climbing 3-5 steps with a railing? : None 6 Click Score: 24    End of Session Equipment Utilized During Treatment: Gait belt Activity Tolerance: Patient tolerated treatment well Patient left: in bed;with bed alarm set;with call  bell/phone within reach Nurse Communication: Mobility status (pt ready for D/C to home) PT Visit Diagnosis: Muscle weakness (generalized) (M62.81);Difficulty in walking, not elsewhere classified (R26.2);Pain Pain - Right/Left: Left Pain - part of body: Knee     Time: 1217-1240 PT Time Calculation (min) (ACUTE ONLY): 23 min  Charges:  $Gait Training: 8-22 mins $Therapeutic Exercise: 8-22 mins                     Felecia Shelling  PTA Acute  Rehabilitation Services Pager      [phone] Office      [phone]

## 2020-01-31 ENCOUNTER — Encounter (HOSPITAL_BASED_OUTPATIENT_CLINIC_OR_DEPARTMENT_OTHER): Payer: Self-pay | Admitting: *Deleted

## 2020-01-31 ENCOUNTER — Other Ambulatory Visit: Payer: Self-pay

## 2020-01-31 DIAGNOSIS — Z96659 Presence of unspecified artificial knee joint: Secondary | ICD-10-CM | POA: Diagnosis not present

## 2020-01-31 DIAGNOSIS — Z9889 Other specified postprocedural states: Secondary | ICD-10-CM | POA: Diagnosis not present

## 2020-01-31 DIAGNOSIS — Z5321 Procedure and treatment not carried out due to patient leaving prior to being seen by health care provider: Secondary | ICD-10-CM | POA: Diagnosis not present

## 2020-01-31 DIAGNOSIS — M25569 Pain in unspecified knee: Secondary | ICD-10-CM | POA: Insufficient documentation

## 2020-01-31 NOTE — ED Triage Notes (Signed)
She had a total knee replacement a week ago. Here with pain.

## 2020-02-01 ENCOUNTER — Ambulatory Visit (HOSPITAL_COMMUNITY)
Admission: RE | Admit: 2020-02-01 | Discharge: 2020-02-01 | Disposition: A | Discharge status: Home / Self Care | Source: Ambulatory Visit | Attending: Surgical | Admitting: Surgical

## 2020-02-01 ENCOUNTER — Other Ambulatory Visit: Payer: Self-pay

## 2020-02-01 ENCOUNTER — Ambulatory Visit (INDEPENDENT_AMBULATORY_CARE_PROVIDER_SITE_OTHER): Admitting: Surgical

## 2020-02-01 ENCOUNTER — Ambulatory Visit (INDEPENDENT_AMBULATORY_CARE_PROVIDER_SITE_OTHER)

## 2020-02-01 ENCOUNTER — Telehealth: Payer: Self-pay | Admitting: *Deleted

## 2020-02-01 ENCOUNTER — Emergency Department (HOSPITAL_BASED_OUTPATIENT_CLINIC_OR_DEPARTMENT_OTHER)
Admission: EM | Admit: 2020-02-01 | Discharge: 2020-02-01 | Disposition: A | Discharge status: Home / Self Care | Attending: Emergency Medicine | Admitting: Emergency Medicine

## 2020-02-01 ENCOUNTER — Encounter: Payer: Self-pay | Admitting: Orthopedic Surgery

## 2020-02-01 ENCOUNTER — Encounter: Payer: Self-pay | Admitting: Surgical

## 2020-02-01 ENCOUNTER — Other Ambulatory Visit: Payer: Self-pay | Admitting: Orthopedic Surgery

## 2020-02-01 DIAGNOSIS — Z96652 Presence of left artificial knee joint: Secondary | ICD-10-CM

## 2020-02-01 MED ORDER — HYDROCODONE-ACETAMINOPHEN 5-325 MG PO TABS: 1.0000 | ORAL_TABLET | Freq: Four times a day (QID) | ORAL | 0 refills | Status: DC | PRN

## 2020-02-01 MED ORDER — ASPIRIN 81 MG PO CHEW: 81.0000 mg | CHEWABLE_TABLET | Freq: Every day | ORAL | 0 refills | Status: DC

## 2020-02-01 NOTE — Progress Notes (Signed)
Post-Op Visit Note   Patient: Beverly Salazar           Date of Birth: Mar 15, 1966           MRN: [id number] Visit Date: 02/01/2020 PCP: Zoila Shutter, MD   Assessment & Plan:  Chief Complaint:  Chief Complaint  Patient presents with  . Left Knee - Routine Post Op   Visit Diagnoses:  1. Status post total left knee replacement     Plan: Patient is a 54 year old female presents s/p left total knee arthroplasty on 01/25/2020.  She is 1 week out from procedure and ambulating with a walker.  She complains of severe pain and swelling.  She notes on and off fevers that have been improving but denies any chills, sweats, drainage, malaise.  She has significant pain with range of motion of the left knee but she does have 0 degrees of extension.  She has history of DVT and significant calf tenderness and swelling.  Negative Denna Haggard' sign today.  Radiographs of the left knee were taken today and show a well aligned left knee prosthesis in good position with no complicating features.  She denies any chest pain or shortness of breath.  With her symptoms, plan to order ultrasound of the left lower extremity.  Ultrasound of the left lower extremity was performed and found to be negative for DVT.  Continue weightbearing as tolerated with walker.  Follow-up in about 1 week at her next scheduled appointment.  No need for x-rays at that time.  She is currently out of pain medication.  Discontinue oxycodone and plan to prescribe hydrocodone instead.  Take aspirin for DVT prophylaxis.  Follow-Up Instructions: No follow-ups on file.   Orders:  Orders Placed This Encounter  Procedures  . XR Knee 1-2 Views Left   No orders of the defined types were placed in this encounter.   Imaging: VAS [place] LOWER EXTREMITY VENOUS (DVT)  Result Date: 02/01/2020  Lower Venous DVTStudy Indications: Pain, and post knee procedure.  Limitations: Body habitus. Comparison Study: 09/03/16 negative Performing Technologist: Jeb Levering RDMS, RVT  Examination Guidelines: A complete evaluation includes B-mode imaging, spectral Doppler, color Doppler, and power Doppler as needed of all accessible portions of each vessel. Bilateral testing is considered an integral part of a complete examination. Limited examinations for reoccurring indications may be performed as noted. The reflux portion of the exam is performed with the patient in reverse Trendelenburg.  +-----+---------------+---------+-----------+----------+--------------+ RIGHTCompressibilityPhasicitySpontaneityPropertiesThrombus Aging +-----+---------------+---------+-----------+----------+--------------+ CFV  Full           Yes      Yes                                 +-----+---------------+---------+-----------+----------+--------------+   +---------+---------------+---------+-----------+----------+-------------------+ LEFT     CompressibilityPhasicitySpontaneityPropertiesThrombus Aging      +---------+---------------+---------+-----------+----------+-------------------+ CFV      Full           Yes      Yes                                      +---------+---------------+---------+-----------+----------+-------------------+ SFJ      Full                                                             +---------+---------------+---------+-----------+----------+-------------------+  FV Prox  Full                                                             +---------+---------------+---------+-----------+----------+-------------------+ FV Mid   Full                                                             +---------+---------------+---------+-----------+----------+-------------------+ FV DistalFull                                                             +---------+---------------+---------+-----------+----------+-------------------+ PFV      Full                                                              +---------+---------------+---------+-----------+----------+-------------------+ POP      Full           Yes      Yes                                      +---------+---------------+---------+-----------+----------+-------------------+ PTV      Full                                         not well visualized +---------+---------------+---------+-----------+----------+-------------------+ PERO                                                  Not visualized      +---------+---------------+---------+-----------+----------+-------------------+     Summary: RIGHT: - No evidence of common femoral vein obstruction.  LEFT: - There is no evidence of deep vein thrombosis in the lower extremity. However, portions of this examination were limited- see technologist comments above.  - No cystic structure found in the popliteal fossa.  *See table(s) above for measurements and observations. Electronically signed by Gretta Began MD on 02/01/2020 at 3:57:41 PM.    Final     PMFS History: Patient Active Problem List   Diagnosis Date Noted  . S/P total knee arthroplasty, left 01/25/2020  . Arthritis of knee 12/22/2018  . Unilateral primary osteoarthritis, right knee 02/05/2017   Past Medical History:  Diagnosis Date  . Anemia    history of  . Anxiety   . Arthritis   . Depression   . DVT (deep venous thrombosis) (HCC)    Right calf, has had DVT's twice  . Dyspnea    upon exertion  . GERD (gastroesophageal  reflux disease)   . Hypertension   . Pre-diabetes     No family history on file.  Past Surgical History:  Procedure Laterality Date  . ABDOMINAL HYSTERECTOMY    . KNEE ARTHROSCOPY     right 2012, left 2018  . TOTAL KNEE ARTHROPLASTY Right 12/22/2018   Procedure: RIGHT TOTAL KNEE ARTHROPLASTY;  Surgeon: Cammy Copa, MD;  Location: [facility] OR;  Service: Orthopedics;  Laterality: Right;  . WISDOM TOOTH EXTRACTION     Social History   Occupational History  . Not on file  Tobacco  Use  . Smoking status: Never Smoker  . Smokeless tobacco: Never Used  Vaping Use  . Vaping Use: Never used  Substance and Sexual Activity  . Alcohol use: No  . Drug use: No  . Sexual activity: Yes    Birth control/protection: Surgical

## 2020-02-01 NOTE — Telephone Encounter (Signed)
Vacular doppler scheduled for 4pm today at [address] [facility]

## 2020-02-01 NOTE — Telephone Encounter (Signed)
Pls advise.  

## 2020-02-02 ENCOUNTER — Encounter (HOSPITAL_COMMUNITY): Payer: Self-pay | Admitting: Orthopedic Surgery

## 2020-02-02 MED ORDER — CELECOXIB 200 MG PO CAPS: 200.0000 mg | ORAL_CAPSULE | Freq: Two times a day (BID) | ORAL | 0 refills | Status: DC

## 2020-02-02 NOTE — Telephone Encounter (Signed)
Pls advise.  

## 2020-02-02 NOTE — Telephone Encounter (Signed)
Hydrocodone was Rxed yesterday per her request.  Refilled Celebrex today.

## 2020-02-03 ENCOUNTER — Telehealth: Payer: Self-pay

## 2020-02-03 NOTE — Telephone Encounter (Signed)
IC verbal given.  

## 2020-02-03 NOTE — Telephone Encounter (Signed)
IC advised per below.  °

## 2020-02-03 NOTE — Telephone Encounter (Signed)
WBAT, use walker.  Focus on PROM and AROM with quad strengthening

## 2020-02-03 NOTE — Discharge Summary (Signed)
Physician Discharge Summary      Patient ID:  Beverly Salazar MRN: [id number] DOB/AGE: [birth year] 54 y.o.  Admit date: 01/25/2020 Discharge date: 01/28/2020  Admission Diagnoses:  Active Problems:   S/P total knee arthroplasty, left   Discharge Diagnoses:  Same  Surgeries: Procedure(s): LEFT TOTAL KNEE ARTHROPLASTY on 01/25/2020   Consultants:   Discharged Condition: Stable  Hospital Course: Beverly Salazar is an 54 y.o. female who was admitted 01/25/2020 with a chief complaint of Left knee pain, and found to have a diagnosis of left knee OA.  They were brought to the operating room on 01/25/2020 and underwent the above named procedures.  Pt awoke from anesthesia without complication and was transferred to the floor. On POD1, patient's pain was controlled but she had difficulty with mobilizing.  Her mobility improved over the course of the next several days and she was discharged home on POD3 once she received word that her home DME was being delivered.  Pt will f/u with Dr. August Saucer in clinic in ~2 weeks.   Antibiotics given:  Anti-infectives (From admission, onward)   Start     Dose/Rate Route Frequency Ordered Stop   01/25/20 1400  ceFAZolin (ANCEF) IVPB 2g/100 mL premix        2 g 200 mL/hr over 30 Minutes Intravenous Every 6 hours 01/25/20 1156 01/25/20 2045   01/25/20 0839  vancomycin (VANCOCIN) powder  Status:  Discontinued          As needed 01/25/20 0839 01/25/20 1154   01/25/20 0600  ceFAZolin (ANCEF) IVPB 2g/100 mL premix        2 g 200 mL/hr over 30 Minutes Intravenous On call to O.R. 01/25/20 0542 01/25/20 0745    .  Recent vital signs:  Vitals:   01/27/20 2107 01/28/20 0603  BP: 117/77 130/78  Pulse: (!) 101 (!) 101  Resp: 18 18  Temp: 98.1 F (36.7 C) 98.5 F (36.9 C)  SpO2: 100% 91%    Recent laboratory studies:  Results for orders placed or performed during the hospital encounter of 01/25/20  Type and screen [facility]  Result Value  Ref Range   ABO/RH(D) O POS    Antibody Screen NEG    Sample Expiration      01/28/2020,2359 Performed at [facility], [address] [place], [place] [address]   ABO/Rh  Result Value Ref Range   ABO/RH(D)      O POS Performed at [facility], [address] [place], [place] [address]     Discharge Medications:   Allergies as of 01/28/2020      Reactions   Tetanus Toxoids Swelling   Redness and swelling around injection site      Medication List    STOP taking these medications   amoxicillin 500 MG tablet Commonly known as: AMOXIL   meloxicam 15 MG tablet Commonly known as: Mobic   traZODone 50 MG tablet Commonly known as: DESYREL     TAKE these medications   acetaminophen 500 MG tablet Commonly known as: TYLENOL Take 500-1,000 mg by mouth every 6 (six) hours as needed for moderate pain or headache.   albuterol 108 (90 Base) MCG/ACT inhaler Commonly known as: VENTOLIN HFA Inhale 2 puffs into the lungs every 6 (six) hours as needed for wheezing or shortness of breath.   ALPRAZolam 0.5 MG tablet Commonly known as: XANAX Take 0.25-0.5 mg by mouth 3 (three) times daily as needed for anxiety.  baclofen 10 MG tablet Commonly known as: LIORESAL Take 10 mg by mouth 3 (three) times daily as needed for muscle spasms.   buPROPion 150 MG 24 hr tablet Commonly known as: WELLBUTRIN XL Take 150 mg by mouth at bedtime. Take with the 300 mg dose to equal 450 mg daily.   buPROPion 300 MG 24 hr tablet Commonly known as: WELLBUTRIN XL Take 300 mg by mouth at bedtime. Take with 150 mg dose to equal 450 mg daily   cholecalciferol 25 MCG (1000 UNIT) tablet Commonly known as: VITAMIN D3 Take 1,000 Units by mouth daily.   DULoxetine 30 MG capsule Commonly known as: CYMBALTA Take 60 mg by mouth daily.   ferrous sulfate 325 (65 FE) MG tablet Take 325 mg by mouth 2 (two) times a week. On Sundays and Wednesdays   furosemide 20 MG  tablet Commonly known as: LASIX Take 20 mg by mouth daily.   hydrochlorothiazide 25 MG tablet Commonly known as: HYDRODIURIL Take 25 mg by mouth daily.   multivitamin with minerals Tabs tablet Take 1 tablet by mouth daily.   oxyCODONE 5 MG immediate release tablet Commonly known as: Oxy IR/ROXICODONE Take 1-2 tablets (5-10 mg total) by mouth every 4 (four) hours as needed for moderate pain (pain score 4-6).   phentermine 37.5 MG capsule Take 37.5 mg by mouth daily.   potassium chloride 10 MEQ tablet Commonly known as: KLOR-CON 1 po bid x 5 days What changed:   how much to take  how to take this  when to take this  additional instructions   vitamin B-12 1000 MCG tablet Commonly known as: CYANOCOBALAMIN Take 1,000 mcg by mouth daily.       Diagnostic Studies: XR Knee 1-2 Views Left  Result Date: 02/01/2020 AP, lateral views of left knee reviewed.  Left total knee prosthesis in good position and alignment.  No fractures noted.  No subluxation or dislocation noted.  No significant varus/valgus deformity.  No lucency surrounding the prosthesis.   VAS US LOWER EXTREMITY VENOUS (DVT)  Result Date: 02/01/2020  Lower Venous DVTStudy Indications: Pain, and post knee procedure.  Limitations: Body habitus. Comparison Study: 09/03/16 negative Performing Technologist: Jeb LeveringJill Parker RDMS, RVT  Examination Guidelines: A complete evaluation includes B-mode imaging, spectral Doppler, color Doppler, and power Doppler as needed of all accessible portions of each vessel. Bilateral testing is considered an integral part of a complete examination. Limited examinations for reoccurring indications may be performed as noted. The reflux portion of the exam is performed with the patient in reverse Trendelenburg.  +-----+---------------+---------+-----------+----------+--------------+ RIGHTCompressibilityPhasicitySpontaneityPropertiesThrombus Aging  +-----+---------------+---------+-----------+----------+--------------+ CFV  Full           Yes      Yes                                 +-----+---------------+---------+-----------+----------+--------------+   +---------+---------------+---------+-----------+----------+-------------------+ LEFT     CompressibilityPhasicitySpontaneityPropertiesThrombus Aging      +---------+---------------+---------+-----------+----------+-------------------+ CFV      Full           Yes      Yes                                      +---------+---------------+---------+-----------+----------+-------------------+ SFJ      Full                                                             +---------+---------------+---------+-----------+----------+-------------------+  FV Prox  Full                                                             +---------+---------------+---------+-----------+----------+-------------------+ FV Mid   Full                                                             +---------+---------------+---------+-----------+----------+-------------------+ FV DistalFull                                                             +---------+---------------+---------+-----------+----------+-------------------+ PFV      Full                                                             +---------+---------------+---------+-----------+----------+-------------------+ POP      Full           Yes      Yes                                      +---------+---------------+---------+-----------+----------+-------------------+ PTV      Full                                         not well visualized +---------+---------------+---------+-----------+----------+-------------------+ PERO                                                  Not visualized      +---------+---------------+---------+-----------+----------+-------------------+     Summary: RIGHT: - No  evidence of common femoral vein obstruction.  LEFT: - There is no evidence of deep vein thrombosis in the lower extremity. However, portions of this examination were limited- see technologist comments above.  - No cystic structure found in the popliteal fossa.  *See table(s) above for measurements and observations. Electronically signed by Gretta Began MD on 02/01/2020 at 3:57:41 PM.    Final     Disposition: Discharge disposition: 01-Home or Self Care       Discharge Instructions    Call MD / Call 911   Complete by: As directed    If you experience chest pain or shortness of breath, CALL 911 and be transported to the hospital emergency room.  If you develope a fever above 101 F, pus (white drainage) or increased drainage or redness at the wound, or calf pain, call your surgeon's office.   Constipation Prevention   Complete by: As directed  Drink plenty of fluids.  Prune juice may be helpful.  You may use a stool softener, such as Colace (over the counter) 100 mg twice a day.  Use MiraLax (over the counter) for constipation as needed.   Diet - low sodium heart healthy   Complete by: As directed    Discharge instructions   Complete by: As directed    CPM 1 hour 3 times a day minimum.  Increase degrees daily Okay to shower dressing is waterproof Weightbearing as tolerated affected knee   Increase activity slowly as tolerated   Complete by: As directed          Signed: Julieanne Cotton 02/03/2020, 10:40 AM

## 2020-02-03 NOTE — Telephone Encounter (Signed)
Pls advise thanks.  

## 2020-02-03 NOTE — Telephone Encounter (Signed)
Vernona Rieger, PT with [facility] would like to know if there are any restrictions for patient's left knee?  Patient had left total knee surgery on 01/25/2020.  Cb# [phone].  Please advise.  Thank you.

## 2020-02-03 NOTE — Telephone Encounter (Signed)
Vernona Rieger, PT with [facility] would like verbal orders, 2 x week for 4 weeks for strength, balance, endurance, ROM, and gait training.  CB# [phone].  Please advise.  Thank you.

## 2020-02-04 ENCOUNTER — Telehealth: Payer: Self-pay

## 2020-02-04 NOTE — Telephone Encounter (Signed)
Vernona Rieger with [facility] stated that she was doing occupational therapy assessment on patient and would like verbal orders for dressing and bathing.  Cb# [phone].  Please advise.  Thank you.

## 2020-02-04 NOTE — Telephone Encounter (Signed)
IC verbal given.  

## 2020-02-09 ENCOUNTER — Ambulatory Visit (INDEPENDENT_AMBULATORY_CARE_PROVIDER_SITE_OTHER): Admitting: Orthopedic Surgery

## 2020-02-09 ENCOUNTER — Encounter: Payer: Self-pay | Admitting: Orthopedic Surgery

## 2020-02-09 DIAGNOSIS — Z96652 Presence of left artificial knee joint: Secondary | ICD-10-CM

## 2020-02-09 MED ORDER — NAPROXEN 500 MG PO TABS
ORAL_TABLET | ORAL | 0 refills | Status: DC
Start: 2020-02-09 — End: 2020-06-23

## 2020-02-11 ENCOUNTER — Emergency Department (HOSPITAL_COMMUNITY)
Admission: EM | Admit: 2020-02-11 | Discharge: 2020-02-11 | Disposition: A | Discharge status: Home / Self Care | Attending: Emergency Medicine | Admitting: Emergency Medicine

## 2020-02-11 ENCOUNTER — Encounter (HOSPITAL_COMMUNITY): Payer: Self-pay

## 2020-02-11 ENCOUNTER — Other Ambulatory Visit: Payer: Self-pay

## 2020-02-11 DIAGNOSIS — I471 Supraventricular tachycardia: Secondary | ICD-10-CM

## 2020-02-11 DIAGNOSIS — Z79899 Other long term (current) drug therapy: Secondary | ICD-10-CM | POA: Insufficient documentation

## 2020-02-11 DIAGNOSIS — I10 Essential (primary) hypertension: Secondary | ICD-10-CM | POA: Insufficient documentation

## 2020-02-11 DIAGNOSIS — R42 Dizziness and giddiness: Secondary | ICD-10-CM | POA: Diagnosis present

## 2020-02-11 DIAGNOSIS — Z96652 Presence of left artificial knee joint: Secondary | ICD-10-CM | POA: Insufficient documentation

## 2020-02-11 DIAGNOSIS — E876 Hypokalemia: Secondary | ICD-10-CM | POA: Insufficient documentation

## 2020-02-11 DIAGNOSIS — I472 Ventricular tachycardia: Secondary | ICD-10-CM | POA: Insufficient documentation

## 2020-02-11 LAB — BASIC METABOLIC PANEL
Anion gap: 7 (ref 5–15)
BUN: 17 mg/dL (ref 6–20)
CO2: 23 mmol/L (ref 22–32)
Calcium: 7.3 mg/dL — ABNORMAL LOW (ref 8.9–10.3)
Chloride: 109 mmol/L (ref 98–111)
Creatinine, Ser: 0.97 mg/dL (ref 0.44–1.00)
GFR calc Af Amer: >60 mL/min (ref >60)
GFR calc non Af Amer: >60 mL/min (ref >60)
Glucose, Bld: 85 mg/dL (ref 70–99)
Potassium: 2.8 mmol/L — ABNORMAL LOW (ref 3.5–5.1)
Sodium: 139 mmol/L (ref 135–145)

## 2020-02-11 LAB — CBC WITH DIFFERENTIAL/PLATELET
Abs Immature Granulocytes: 0.01 10*3/uL (ref 0.00–0.07)
Basophils Absolute: 0 10*3/uL (ref 0.0–0.1)
Basophils Relative: 1 %
Eosinophils Absolute: 0.1 10*3/uL (ref 0.0–0.5)
Eosinophils Relative: 1 %
HCT: 31.6 % — ABNORMAL LOW (ref 36.0–46.0)
Hemoglobin: 10.4 g/dL — ABNORMAL LOW (ref 12.0–15.0)
Immature Granulocytes: 0 %
Lymphocytes Relative: 43 %
Lymphs Abs: 2.6 10*3/uL (ref 0.7–4.0)
MCH: 30.2 pg (ref 26.0–34.0)
MCHC: 32.9 g/dL (ref 30.0–36.0)
MCV: 91.9 fL (ref 80.0–100.0)
Monocytes Absolute: 0.6 10*3/uL (ref 0.1–1.0)
Monocytes Relative: 9 %
Neutro Abs: 2.7 10*3/uL (ref 1.7–7.7)
Neutrophils Relative %: 46 %
Platelets: 466 10*3/uL — ABNORMAL HIGH (ref 150–400)
RBC: 3.44 MIL/uL — ABNORMAL LOW (ref 3.87–5.11)
RDW: 14.1 % (ref 11.5–15.5)
WBC: 6 10*3/uL (ref 4.0–10.5)
nRBC: 0 % (ref 0.0–0.2)

## 2020-02-11 MED ORDER — POTASSIUM CHLORIDE CRYS ER 20 MEQ PO TBCR
40.0000 meq | EXTENDED_RELEASE_TABLET | Freq: Once | ORAL | Status: AC
  Administered 2020-02-11: 40 meq via ORAL
  Filled 2020-02-11: qty 2

## 2020-02-11 MED ORDER — SODIUM CHLORIDE 0.9 % IV BOLUS
500.0000 mL | Freq: Once | INTRAVENOUS | Status: AC
  Administered 2020-02-11: 500 mL via INTRAVENOUS

## 2020-02-11 NOTE — ED Triage Notes (Signed)
Pt BIB EMS from home. Pt had left knee replacement x3 weeks ago at [facility]. Pt c/o episodes of dizziness, tachycardia. EMS reports SVT. Pt on aspirin post surgery. Pt states she was evaluated for blood clots after surgery. Pt denies complications to surgical site, had a follow up with surgeon yesterday. Knee pain 6/10 6 mg of adenosine given by EMS 4mg  zofran given by EMS 300 ML NS given by EMS Pt denies chest pain  110/80 80 HR currently 100% RA 18G L AC

## 2020-02-11 NOTE — Discharge Instructions (Addendum)
Your heart rate was running fast, likely from a constellation of symptoms including low potassium, low calcium and dehydration.  Also the pain in your left knee could have stimulated rapid heartbeat.  For now, double up on your potassium for 1 week.  Also avoid taking the phentermine for 2 or 3 days since that can make your heart rate go faster.  Make sure you are eating a regular diet, and drinking plenty of fluids.  Return here, if needed, for problems.

## 2020-02-11 NOTE — ED Provider Notes (Signed)
[facility]-EMERGENCY DEPT Provider Note   CSN: [id number] Arrival date & time: 02/11/20  2110     History Chief Complaint  Patient presents with  . SVT  . Dizziness    Beverly Salazar is a 54 y.o. female.  HPI She presents for evaluation of dizziness with SVT.  She called EMS who arrived and found her with heart rate greater than 200.  She was treated with adenosine with successful conversion to normal sinus rhythm.  No prior similar problem.  Patient has had a couple of other episodes of dizziness today, the first occurred when she was doing in-home rehab, following her knee replacement surgery 2 weeks ago.  She denies nausea, vomiting, weakness or paresthesia.  No prior cardiac problems.  She takes phentermine, for weight loss.  There are no other known modifying factors.    Past Medical History:  Diagnosis Date  . Anemia    history of  . Anxiety   . Arthritis   . Depression   . DVT (deep venous thrombosis) (HCC)    Right calf, has had DVT's twice  . Dyspnea    upon exertion  . GERD (gastroesophageal reflux disease)   . Hypertension   . Pre-diabetes     Patient Active Problem List   Diagnosis Date Noted  . S/P total knee arthroplasty, left 01/25/2020  . Arthritis of knee 12/22/2018  . Unilateral primary osteoarthritis, right knee 02/05/2017    Past Surgical History:  Procedure Laterality Date  . ABDOMINAL HYSTERECTOMY    . KNEE ARTHROSCOPY     right 2012, left 2018  . TOTAL KNEE ARTHROPLASTY Right 12/22/2018   Procedure: RIGHT TOTAL KNEE ARTHROPLASTY;  Surgeon: Cammy Copa, MD;  Location: [facility] OR;  Service: Orthopedics;  Laterality: Right;  . TOTAL KNEE ARTHROPLASTY Left 01/25/2020   Procedure: LEFT TOTAL KNEE ARTHROPLASTY;  Surgeon: Cammy Copa, MD;  Location: [facility] ORS;  Service: Orthopedics;  Laterality: Left;  . WISDOM TOOTH EXTRACTION       OB History   No obstetric history on file.     History reviewed. No pertinent  family history.  Social History   Tobacco Use  . Smoking status: Never Smoker  . Smokeless tobacco: Never Used  Vaping Use  . Vaping Use: Never used  Substance Use Topics  . Alcohol use: No  . Drug use: No    Home Medications Prior to Admission medications   Medication Sig Start Date End Date Taking? Authorizing Provider  acetaminophen (TYLENOL) 500 MG tablet Take 500-1,000 mg by mouth every 6 (six) hours as needed for moderate pain or headache.    [provider]  albuterol (VENTOLIN HFA) 108 (90 Base) MCG/ACT inhaler Inhale 2 puffs into the lungs every 6 (six) hours as needed for wheezing or shortness of breath.    [provider]  ALPRAZolam Prudy Feeler) 0.5 MG tablet Take 0.25-0.5 mg by mouth 3 (three) times daily as needed for anxiety.    [provider]  aspirin (ASPIRIN LOW DOSE) 81 MG chewable tablet Chew 1 tablet (81 mg total) by mouth daily. 02/01/20   Magnant, Charles L, PA-C  baclofen (LIORESAL) 10 MG tablet Take 10 mg by mouth 3 (three) times daily as needed for muscle spasms.    [provider]  buPROPion (WELLBUTRIN XL) 150 MG 24 hr tablet Take 150 mg by mouth at bedtime. Take with the 300 mg dose to equal 450 mg daily.    [provider]  buPROPion (WELLBUTRIN XL) 300 MG 24 hr tablet Take 300 mg by mouth at bedtime. Take with 150 mg dose to equal 450 mg daily 12/28/19   [provider]  celecoxib (CELEBREX) 200 MG capsule Take 1 capsule (200 mg total) by mouth 2 (two) times daily. 02/02/20   Magnant, Joycie Peekharles L, PA-C  cholecalciferol (VITAMIN D3) 25 MCG (1000 UT) tablet Take 1,000 Units by mouth daily.    [provider]  DULoxetine (CYMBALTA) 30 MG capsule Take 60 mg by mouth daily.    [provider]  ferrous sulfate 325 (65 FE) MG tablet Take 325 mg by mouth 2 (two) times a week. On Sundays and Wednesdays    [provider]  furosemide (LASIX) 20 MG tablet Take 20 mg by mouth daily.    [provider]  hydrochlorothiazide (HYDRODIURIL) 25 MG tablet Take 25 mg by mouth daily.    [provider]  HYDROcodone-acetaminophen (NORCO/VICODIN) 5-325 MG tablet Take 1 tablet by mouth every 6 (six) hours as needed for moderate pain. 02/01/20   Magnant, Joycie Peekharles L, PA-C  Multiple Vitamin (MULTIVITAMIN WITH MINERALS) TABS tablet Take 1 tablet by mouth daily.     [provider]  naproxen (NAPROSYN) 500 MG tablet 1 po bid x 3 weeks 02/09/20   Cammy Copaean, Gregory Scott, MD  oxyCODONE (OXY IR/ROXICODONE) 5 MG immediate release tablet Take 1-2 tablets (5-10 mg total) by mouth every 4 (four) hours as needed for moderate pain (pain score 4-6). 01/27/20   Cammy Copaean, Gregory Scott, MD  phentermine 37.5 MG capsule Take 37.5 mg by mouth daily. 11/11/18   [provider]  potassium chloride (K-DUR) 10 MEQ tablet 1 po bid x 5 days Patient taking differently: Take 10 mEq by mouth 2 (two) times daily.  12/16/18   Cammy Copaean, Gregory Scott, MD  vitamin B-12 (CYANOCOBALAMIN) 1000 MCG tablet Take 1,000 mcg by mouth daily.    [provider]    Allergies    Tetanus toxoids  Review of Systems   Review of Systems  All other systems reviewed and are negative.   Physical Exam Updated Vital Signs BP 131/80   Pulse 87   Temp 99 F (37.2 C) (Oral)   Resp 18   Ht 5\' 8"  (1.727 m)   Wt 108 kg   LMP  (LMP Unknown)   SpO2 100%   BMI 36.19 kg/m   Physical Exam Vitals and nursing note reviewed.  Constitutional:      General: She is not in acute distress.    Appearance: She is well-developed. She is obese. She is not ill-appearing, toxic-appearing or diaphoretic.  HENT:     Head: Normocephalic and atraumatic.     Right Ear: External ear normal.     Left Ear: External ear normal.  Eyes:     Conjunctiva/sclera: Conjunctivae normal.     Pupils: Pupils are equal, round, and reactive to light.  Neck:     Trachea: Phonation normal.  Cardiovascular:     Rate and Rhythm: Normal rate and  regular rhythm.     Heart sounds: Normal heart sounds.  Pulmonary:     Effort: Pulmonary effort is normal.     Breath sounds: Normal breath sounds.  Abdominal:     Palpations: Abdomen is soft.     Tenderness: There is no abdominal tenderness.  Musculoskeletal:     Cervical back: Normal range of motion and neck supple.     Comments: Left knee swollen but nontender.  Normal  healing anterior scar.  Skin:    General: Skin is warm and dry.  Neurological:     Mental Status: She is alert and oriented to person, place, and time.     Cranial Nerves: No cranial nerve deficit.     Sensory: No sensory deficit.     Motor: No abnormal muscle tone.     Coordination: Coordination normal.  Psychiatric:        Mood and Affect: Mood normal.        Behavior: Behavior normal.        Thought Content: Thought content normal.        Judgment: Judgment normal.     ED Results / Procedures / Treatments   Labs (all labs ordered are listed, but only abnormal results are displayed) Labs Reviewed  BASIC METABOLIC PANEL - Abnormal; Notable for the following components:      Result Value   Potassium 2.8 (*)    Calcium 7.3 (*)    All other components within normal limits  CBC WITH DIFFERENTIAL/PLATELET - Abnormal; Notable for the following components:   RBC 3.44 (*)    Hemoglobin 10.4 (*)    HCT 31.6 (*)    Platelets 466 (*)    All other components within normal limits    EKG EKG Interpretation  Date/Time:  Friday February 11 2020 22:07:15 EDT Ventricular Rate:  80 PR Interval:    QRS Duration: 95 QT Interval:  414 QTC Calculation: 478 R Axis:   23 Text Interpretation: Sinus rhythm Multiple premature complexes, vent & supraven Borderline T wave abnormalities Since last tracing pvc is new Otherwise no significant change Confirmed by Mancel Bale [phone]) on 02/11/2020 10:31:57 PM   Radiology No results found.  Procedures Procedures (including critical care time)  Medications Ordered in  ED Medications  potassium chloride SA (KLOR-CON) CR tablet 40 mEq (has no administration in time range)  sodium chloride 0.9 % bolus 500 mL (500 mLs Intravenous Bolus from Bag 02/11/20 2207)    ED Course  I have reviewed the triage vital signs and the nursing notes.  Pertinent labs & imaging results that were available during my care of the patient were reviewed by me and considered in my medical decision making (see chart for details).  Clinical Course as of Feb 11 2232  Fri Feb 11, 2020  2233 Normal except hemoglobin low, platelets high  CBC with Differential(!) [EW]  2233 Normal except potassium and calcium low  Basic metabolic panel(!) [EW]    Clinical Course User Index [EW] Mancel Bale, MD   MDM Rules/Calculators/A&P                           Patient Vitals for the past 24 hrs:  BP Temp Temp src Pulse Resp SpO2 Height Weight  02/11/20 2314 108/72 98.8 F (37.1 C) Oral 73 16 99 % -- --  02/11/20 2200 131/80 -- -- 87 18 100 % -- --  02/11/20 2127 123/82 99 F (37.2 C) Oral 78 18 100 % 5\' 8"  (1.727 m) 108 kg    At discharge- reevaluation with update and discussion. After initial assessment and treatment, an updated evaluation reveals she is comfortable and has no further complaints.  Findings discussed and questions answered.   Medical Decision Making:  This patient is presenting for evaluation of SVT, which does require a range of treatment options, and is a complaint that involves a high risk of morbidity  and mortality. The differential diagnoses include tachycardia, atrial fibrillation, SVT. I decided to review old records, and in summary young female with recent total knee replacement presenting and is associated with rapid heartbeat..  I did not require additional historical information from anyone.     Cardiac Monitor Tracing which shows normal sinus rhythm    Critical Interventions-clinical evaluation, laboratory testing, observation, treatment  with IV fluids and potassium, reassessment  After These Interventions, the Patient was reevaluated and was found stable for discharge.  Patient has single episode of SVT documented by EMS, improved after a single dose of adenosine and not recurring.  This was likely multifactorial associated with recent surgery, pain during evaluation by physical therapy, and me with low calcium.  Current of symptoms, with reassuring evaluation in the ED.  No indication for hospitalization or intervention.  Doubt atrial fibrillation  CRITICAL CARE-no Performed by: Mancel Bale  Nursing Notes Reviewed/ Care Coordinated Applicable Imaging Reviewed Interpretation of Laboratory Data incorporated into ED treatment  The patient appears reasonably screened and/or stabilized for discharge and I doubt any other medical condition or other [facility] requiring further screening, evaluation, or treatment in the ED at this time prior to discharge.  Plan: Home Medications-continue usual, up on potassium for 1 week and avoid phentermine for 3 days; Home Treatments-rest, fluids; return here if the recommended treatment, does not improve the symptoms; Recommended follow up-PCP, as needed.  Orthopedics as scheduled.     Final Clinical Impression(s) / ED Diagnoses Final diagnoses:  SVT (supraventricular tachycardia) (HCC)  Hypokalemia  Hypocalcemia    Rx / DC Orders ED Discharge Orders    None       Mancel Bale, MD 02/12/20 [phone]

## 2020-02-13 NOTE — Progress Notes (Signed)
   Post-Op Visit Note   Patient: Beverly Salazar           Date of Birth: 1965/08/25           MRN: [id number] Visit Date: 02/09/2020 PCP: Zoila Shutter, MD   Assessment & Plan:  Chief Complaint: No chief complaint on file.  Visit Diagnoses:  1. S/P total knee arthroplasty, left     Plan: [name] is now about 3 weeks out left total knee replacement.  Ultrasound was negative for DVT.  She is ambulating with a walker.  On exam range of motion 0-60.  Taking Norco and Tylenol.  Like to add anti-inflammatory to try to help her with her range of motion.  3-week return for recheck on flexion.  Follow-Up Instructions: No follow-ups on file.   Orders:  No orders of the defined types were placed in this encounter.  Meds ordered this encounter  Medications  . naproxen (NAPROSYN) 500 MG tablet    Sig: 1 po bid x 3 weeks    Dispense:  42 tablet    Refill:  0    Imaging: No results found.  PMFS History: Patient Active Problem List   Diagnosis Date Noted  . S/P total knee arthroplasty, left 01/25/2020  . Arthritis of knee 12/22/2018  . Unilateral primary osteoarthritis, right knee 02/05/2017   Past Medical History:  Diagnosis Date  . Anemia    history of  . Anxiety   . Arthritis   . Depression   . DVT (deep venous thrombosis) (HCC)    Right calf, has had DVT's twice  . Dyspnea    upon exertion  . GERD (gastroesophageal reflux disease)   . Hypertension   . Pre-diabetes     History reviewed. No pertinent family history.  Past Surgical History:  Procedure Laterality Date  . ABDOMINAL HYSTERECTOMY    . KNEE ARTHROSCOPY     right 2012, left 2018  . TOTAL KNEE ARTHROPLASTY Right 12/22/2018   Procedure: RIGHT TOTAL KNEE ARTHROPLASTY;  Surgeon: Cammy Copa, MD;  Location: [facility] OR;  Service: Orthopedics;  Laterality: Right;  . TOTAL KNEE ARTHROPLASTY Left 01/25/2020   Procedure: LEFT TOTAL KNEE ARTHROPLASTY;  Surgeon: Cammy Copa, MD;  Location: [facility] ORS;  Service:  Orthopedics;  Laterality: Left;  . WISDOM TOOTH EXTRACTION     Social History   Occupational History  . Not on file  Tobacco Use  . Smoking status: Never Smoker  . Smokeless tobacco: Never Used  Vaping Use  . Vaping Use: Never used  Substance and Sexual Activity  . Alcohol use: No  . Drug use: No  . Sexual activity: Yes    Birth control/protection: Surgical

## 2020-02-17 ENCOUNTER — Telehealth: Payer: Self-pay | Admitting: Radiology

## 2020-02-17 NOTE — Telephone Encounter (Signed)
FYI

## 2020-02-17 NOTE — Telephone Encounter (Signed)
[facility] called to advise that they are discharging patient from Home Health PT due to starting outpatient PT next week.

## 2020-02-19 NOTE — Telephone Encounter (Signed)
thx

## 2020-02-24 ENCOUNTER — Encounter: Payer: Self-pay | Admitting: Orthopedic Surgery

## 2020-02-24 NOTE — Telephone Encounter (Signed)
Dc machine thx

## 2020-03-01 ENCOUNTER — Ambulatory Visit (INDEPENDENT_AMBULATORY_CARE_PROVIDER_SITE_OTHER): Admitting: Orthopedic Surgery

## 2020-03-01 ENCOUNTER — Encounter: Payer: Self-pay | Admitting: Orthopedic Surgery

## 2020-03-01 DIAGNOSIS — Z96652 Presence of left artificial knee joint: Secondary | ICD-10-CM

## 2020-03-01 NOTE — Progress Notes (Signed)
   Post-Op Visit Note   Patient: Beverly Salazar           Date of Birth: 07-06-1965           MRN: [id number] Visit Date: 03/01/2020 PCP: Beverly Shutter, MD   Assessment & Plan:  Chief Complaint:  Chief Complaint  Patient presents with  . Left Knee - Routine Post Op   Visit Diagnoses:  1. S/P total knee arthroplasty, left     Plan: Beverly Salazar is a 54 year old patient who is now about a month out left total knee replacement.  She is been doing aggressive physical therapy.  On exam negative Homans no calf tenderness flexion is almost to 90.  On her to do outpatient PT in [facility].  6-week return to recheck range of motion.  Follow-Up Instructions: Return in about 6 weeks (around 04/12/2020).   Orders:  No orders of the defined types were placed in this encounter.  No orders of the defined types were placed in this encounter.   Imaging: No results found.  PMFS History: Patient Active Problem List   Diagnosis Date Noted  . S/P total knee arthroplasty, left 01/25/2020  . Arthritis of knee 12/22/2018  . Unilateral primary osteoarthritis, right knee 02/05/2017   Past Medical History:  Diagnosis Date  . Anemia    history of  . Anxiety   . Arthritis   . Depression   . DVT (deep venous thrombosis) (HCC)    Right calf, has had DVT's twice  . Dyspnea    upon exertion  . GERD (gastroesophageal reflux disease)   . Hypertension   . Pre-diabetes     History reviewed. No pertinent family history.  Past Surgical History:  Procedure Laterality Date  . ABDOMINAL HYSTERECTOMY    . KNEE ARTHROSCOPY     right 2012, left 2018  . TOTAL KNEE ARTHROPLASTY Right 12/22/2018   Procedure: RIGHT TOTAL KNEE ARTHROPLASTY;  Surgeon: Cammy Copa, MD;  Location: [facility] OR;  Service: Orthopedics;  Laterality: Right;  . TOTAL KNEE ARTHROPLASTY Left 01/25/2020   Procedure: LEFT TOTAL KNEE ARTHROPLASTY;  Surgeon: Cammy Copa, MD;  Location: [facility] ORS;  Service: Orthopedics;  Laterality:  Left;  . WISDOM TOOTH EXTRACTION     Social History   Occupational History  . Not on file  Tobacco Use  . Smoking status: Never Smoker  . Smokeless tobacco: Never Used  Vaping Use  . Vaping Use: Never used  Substance and Sexual Activity  . Alcohol use: No  . Drug use: No  . Sexual activity: Yes    Birth control/protection: Surgical

## 2020-03-19 ENCOUNTER — Other Ambulatory Visit: Payer: Self-pay | Admitting: Surgical

## 2020-03-20 NOTE — Telephone Encounter (Signed)
Please advise 

## 2020-03-21 ENCOUNTER — Other Ambulatory Visit: Payer: Self-pay | Admitting: Surgical

## 2020-03-21 ENCOUNTER — Encounter: Payer: Self-pay | Admitting: Orthopedic Surgery

## 2020-03-21 NOTE — Telephone Encounter (Signed)
Pls advise. Thanks.  

## 2020-03-21 NOTE — Telephone Encounter (Signed)
Sorry that she's having such a hard time.  Totally normal to still have pain and difficulty with PT at this point in recovery but she can come in to see me on a Friday afternoon if she wants to be seen prior to her 11/3 appointment with Dr. August Saucer.  I will refill her medication, please make sure she does not take Celebrex with Naproxen

## 2020-03-23 ENCOUNTER — Other Ambulatory Visit: Payer: Self-pay | Admitting: Surgical

## 2020-03-23 MED ORDER — CELECOXIB 200 MG PO CAPS
ORAL_CAPSULE | ORAL | 0 refills | Status: DC
Start: 2020-03-23 — End: 2020-06-23

## 2020-03-24 ENCOUNTER — Other Ambulatory Visit: Payer: Self-pay

## 2020-03-24 ENCOUNTER — Ambulatory Visit (INDEPENDENT_AMBULATORY_CARE_PROVIDER_SITE_OTHER): Payer: Self-pay | Admitting: Orthopedic Surgery

## 2020-03-24 ENCOUNTER — Telehealth: Payer: Self-pay | Admitting: *Deleted

## 2020-03-24 ENCOUNTER — Encounter: Payer: Self-pay | Admitting: Orthopedic Surgery

## 2020-03-24 ENCOUNTER — Ambulatory Visit (HOSPITAL_COMMUNITY): Admission: RE | Admit: 2020-03-24 | Payer: Federal, State, Local not specified - PPO | Source: Ambulatory Visit

## 2020-03-24 ENCOUNTER — Ambulatory Visit (HOSPITAL_BASED_OUTPATIENT_CLINIC_OR_DEPARTMENT_OTHER)
Admission: RE | Admit: 2020-03-24 | Discharge: 2020-03-24 | Disposition: A | Discharge status: Home / Self Care | Source: Ambulatory Visit | Attending: Surgical | Admitting: Surgical

## 2020-03-24 ENCOUNTER — Ambulatory Visit: Payer: Self-pay

## 2020-03-24 DIAGNOSIS — Z96652 Presence of left artificial knee joint: Secondary | ICD-10-CM

## 2020-03-24 NOTE — Telephone Encounter (Signed)
Pt is scheduled today for Ultrasound DVT at [facility] at 1pm, pt is to arrive at 1245pm to register. I called pt and left vm with appt information and instructions.

## 2020-03-25 ENCOUNTER — Encounter: Payer: Self-pay | Admitting: Orthopedic Surgery

## 2020-03-25 NOTE — Progress Notes (Signed)
Post-Op Visit Note   Patient: Beverly Salazar           Date of Birth: [birth year]           MRN: [id number] Visit Date: 03/24/2020 PCP: Zoila Shutter, MD   Assessment & Plan:  Chief Complaint:  Chief Complaint  Patient presents with  . Left Knee - Pain   Visit Diagnoses:  1. S/P total knee arthroplasty, left     Plan: Patient is a 54 year old female presents s/p left total knee arthroplasty on 01/25/2020.  She notes that over the last week she has had increasing pain without any injury.  She has only been taking Tylenol for pain.  She denies any fevers, chills, malaise, drainage from the incision.  She has been taking aspirin for DVT prophylaxis.  She has a history of DVT but had a negative ultrasound in the first 2 weeks postoperatively.  She localizes the majority of her pain to the posterior aspect of the left knee.  On exam she has passive flexion to about 90 degrees.  No significant effusion of the left knee.  Incision is well-healed without any evidence of infection such as erythema or drainage.  She has tenderness diffusely throughout the posterior knee as well as the proximal posterior calf.  She does have weakness with active flexion of the knee compared with the contralateral side as well as moderate tenderness over the pes anserine bursa.  Plan for patient to pick up her Celebrex later today to see if this will help with her pain relief.  Also will order ultrasound of the left lower extremity to rule out DVT with her history of DVT.  Ultrasound was found to be negative.  Radiographs taken today were negative for any acute pathology.  Follow-up in 4 weeks for clinical recheck.  Follow-Up Instructions: No follow-ups on file.   Orders:  Orders Placed This Encounter  Procedures  . XR Knee 1-2 Views Left  . US Venous Img Lower Unilateral Left (DVT)   No orders of the defined types were placed in this encounter.   Imaging: US Venous Img Lower Unilateral Left  (DVT)  Result Date: 03/24/2020 CLINICAL DATA:  Left calf pain 2 months ago since left knee. History of DVT replacement remotely. EXAM: LEFT LOWER EXTREMITY VENOUS DOPPLER ULTRASOUND TECHNIQUE: Gray-scale sonography with compression, as well as color and duplex ultrasound, were performed to evaluate the deep venous system(s) from the level of the common femoral vein through the popliteal and proximal calf veins. COMPARISON:  None available FINDINGS: VENOUS Normal compressibility of the common femoral, superficial femoral, and popliteal veins, as well as the visualized calf veins. Visualized portions of profunda femoral vein and great saphenous vein unremarkable. No filling defects to suggest DVT on grayscale or color Doppler imaging. Doppler waveforms show normal direction of venous flow, normal respiratory plasticity and response to augmentation. Limited views of the contralateral common femoral vein are unremarkable. IMPRESSION: Negative for DVT in the left lower extremity. Electronically Signed   By: Marnee Spring M.D.   On: 03/24/2020 18:33    PMFS History: Patient Active Problem List   Diagnosis Date Noted  . S/P total knee arthroplasty, left 01/25/2020  . Arthritis of knee 12/22/2018  . Unilateral primary osteoarthritis, right knee 02/05/2017   Past Medical History:  Diagnosis Date  . Anemia    history of  . Anxiety   . Arthritis   . Depression   . DVT (deep venous thrombosis) (HCC)  Right calf, has had DVT's twice  . Dyspnea    upon exertion  . GERD (gastroesophageal reflux disease)   . Hypertension   . Pre-diabetes     History reviewed. No pertinent family history.  Past Surgical History:  Procedure Laterality Date  . ABDOMINAL HYSTERECTOMY    . KNEE ARTHROSCOPY     right 2012, left 2018  . TOTAL KNEE ARTHROPLASTY Right 12/22/2018   Procedure: RIGHT TOTAL KNEE ARTHROPLASTY;  Surgeon: Cammy Copa, MD;  Location: [place] OR;  Service: Orthopedics;  Laterality: Right;  .  TOTAL KNEE ARTHROPLASTY Left 01/25/2020   Procedure: LEFT TOTAL KNEE ARTHROPLASTY;  Surgeon: Cammy Copa, MD;  Location: [facility] ORS;  Service: Orthopedics;  Laterality: Left;  . WISDOM TOOTH EXTRACTION     Social History   Occupational History  . Not on file  Tobacco Use  . Smoking status: Never Smoker  . Smokeless tobacco: Never Used  Vaping Use  . Vaping Use: Never used  Substance and Sexual Activity  . Alcohol use: No  . Drug use: No  . Sexual activity: Yes    Birth control/protection: Surgical

## 2020-03-28 ENCOUNTER — Encounter: Payer: Self-pay | Admitting: Orthopedic Surgery

## 2020-03-28 DIAGNOSIS — Z96652 Presence of left artificial knee joint: Secondary | ICD-10-CM

## 2020-03-28 NOTE — Telephone Encounter (Signed)
Sure thx

## 2020-03-29 ENCOUNTER — Telehealth: Payer: Self-pay

## 2020-03-29 NOTE — Telephone Encounter (Signed)
John called in to notify that he will be sending over auth for patient by fax

## 2020-03-30 ENCOUNTER — Other Ambulatory Visit: Payer: Self-pay | Admitting: Surgical

## 2020-03-30 NOTE — Telephone Encounter (Signed)
Please advise 

## 2020-03-31 NOTE — Telephone Encounter (Signed)
New order provided. Morrison Old will submit for auth and as well as to PT facility

## 2020-04-03 ENCOUNTER — Telehealth: Payer: Self-pay | Admitting: Orthopedic Surgery

## 2020-04-03 NOTE — Telephone Encounter (Signed)
Lauren  faxed the PT order to [facility] today. Also, sent order via email to Ms. Smolen also. Thx Tisha

## 2020-04-03 NOTE — Telephone Encounter (Signed)
Do you by chance know the number?

## 2020-04-03 NOTE — Telephone Encounter (Signed)
Pt called asking for the number of the PT facility she was referred to.   [phone]

## 2020-04-12 ENCOUNTER — Ambulatory Visit: Payer: Federal, State, Local not specified - PPO | Admitting: Orthopedic Surgery

## 2020-04-13 ENCOUNTER — Encounter: Payer: Self-pay | Admitting: Orthopedic Surgery

## 2020-04-20 ENCOUNTER — Encounter: Payer: Self-pay | Admitting: Orthopedic Surgery

## 2020-04-20 NOTE — Telephone Encounter (Signed)
ok 

## 2020-04-21 ENCOUNTER — Other Ambulatory Visit: Payer: Self-pay

## 2020-04-21 ENCOUNTER — Other Ambulatory Visit: Payer: Self-pay | Admitting: Surgical

## 2020-04-21 ENCOUNTER — Telehealth: Payer: Self-pay

## 2020-04-21 ENCOUNTER — Ambulatory Visit (INDEPENDENT_AMBULATORY_CARE_PROVIDER_SITE_OTHER): Admitting: Orthopedic Surgery

## 2020-04-21 DIAGNOSIS — M5442 Lumbago with sciatica, left side: Secondary | ICD-10-CM

## 2020-04-21 DIAGNOSIS — G8929 Other chronic pain: Secondary | ICD-10-CM

## 2020-04-21 DIAGNOSIS — M79605 Pain in left leg: Secondary | ICD-10-CM

## 2020-04-21 MED ORDER — TRAMADOL HCL 50 MG PO TABS: 50.0000 mg | ORAL_TABLET | Freq: Two times a day (BID) | ORAL | 0 refills | Status: DC | PRN

## 2020-04-21 NOTE — Telephone Encounter (Signed)
Seen by Dr August Saucer today. Wanted ultram 1 po bid prn pain #30 no RF

## 2020-04-21 NOTE — Telephone Encounter (Signed)
submitted

## 2020-04-22 ENCOUNTER — Encounter: Payer: Self-pay | Admitting: Orthopedic Surgery

## 2020-04-22 NOTE — Progress Notes (Signed)
Office Visit Note   Patient: Beverly Salazar           Date of Birth: [birth year]           MRN: [id number] Visit Date: 04/21/2020 Requested by: Beverly Shutter, MD [address] [place],  [place] [address] PCP: Beverly Shutter, MD  Subjective: Chief Complaint  Patient presents with  . Left Knee - Pain, Follow-up    HPI: Beverly Salazar is a 54 year old patient who underwent left total knee replacement 01/25/2020.  Has some pain.  Doing home exercises.  Reports some calf pain.  She is had ultrasound twice which was negative for any type of DVT.  Taking Tylenol and muscle relaxer.  She does report some low back pain.  She missed physical therapy for about 3 weeks.  Uses ice as well.  Does report some mild low back pain with radiation into that left leg region.              ROS: All systems reviewed are negative as they relate to the chief complaint within the history of present illness.  Patient denies  fevers or chills.   Assessment & Plan: Visit Diagnoses:  1. Pain in left leg   2. Chronic midline low back pain with left-sided sciatica     Plan: Impression is mild loss of flexion in that left knee.  I think that I can get her to 90 degrees today with some effort.  I think if she continues to work pretty hard on flexion she should be able to achieve more flexion than what she has now.  Regarding her back and calf pain I think it is possible this is radiculopathy.  She has had MRI scan over 2 years ago of the lumbar spine which showed some lumbar spine spondylosis.  I think repeat imaging indicated with possible ESI's to follow.  Ultram prescribed.  Follow-up after that scan and we will potentially get her set up with injections.  Continue out of work note for 4 more weeks.  Anticipate return to work early in 2022.  Follow-Up Instructions: Return for after MRI.   Orders:  Orders Placed This Encounter  Procedures  . MR Lumbar Spine w/o contrast   No orders of the defined  types were placed in this encounter.     Procedures: No procedures performed   Clinical Data: No additional findings.  Objective: Vital Signs: LMP  (LMP Unknown)   Physical Exam:   Constitutional: Patient appears well-developed HEENT:  Head: Normocephalic Eyes:EOM are normal Neck: Normal range of motion Cardiovascular: Normal rate Pulmonary/chest: Effort normal Neurologic: Patient is alert Skin: Skin is warm Psychiatric: Patient has normal mood and affect    Ortho Exam: Ortho exam demonstrates full active and passive range of motion of the ankles and hips.  Equivocal nerve root tension signs on the left negative on the right.  No appreciable calf swelling left compared to right.  Negative Homans' sign bilaterally.  No effusion in that left knee.  Range of motion is about 0 to almost 90 degrees.  Does have soft endpoint and I think she could get more flexion with intensive rehabilitation on her part.  No definite paresthesias left versus right leg.  Negative clonus negative Babinski.  Specialty Comments:  No specialty comments available.  Imaging: No results found.   PMFS History: Patient Active Problem List   Diagnosis Date Noted  . S/P total knee arthroplasty, left 01/25/2020  . Arthritis of  knee 12/22/2018  . Unilateral primary osteoarthritis, right knee 02/05/2017   Past Medical History:  Diagnosis Date  . Anemia    history of  . Anxiety   . Arthritis   . Depression   . DVT (deep venous thrombosis) (HCC)    Right calf, has had DVT's twice  . Dyspnea    upon exertion  . GERD (gastroesophageal reflux disease)   . Hypertension   . Pre-diabetes     History reviewed. No pertinent family history.  Past Surgical History:  Procedure Laterality Date  . ABDOMINAL HYSTERECTOMY    . KNEE ARTHROSCOPY     right 2012, left 2018  . TOTAL KNEE ARTHROPLASTY Right 12/22/2018   Procedure: RIGHT TOTAL KNEE ARTHROPLASTY;  Surgeon: Cammy Copa, MD;  Location: [facility]  OR;  Service: Orthopedics;  Laterality: Right;  . TOTAL KNEE ARTHROPLASTY Left 01/25/2020   Procedure: LEFT TOTAL KNEE ARTHROPLASTY;  Surgeon: Cammy Copa, MD;  Location: [facility] ORS;  Service: Orthopedics;  Laterality: Left;  . WISDOM TOOTH EXTRACTION     Social History   Occupational History  . Not on file  Tobacco Use  . Smoking status: Never Smoker  . Smokeless tobacco: Never Used  Vaping Use  . Vaping Use: Never used  Substance and Sexual Activity  . Alcohol use: No  . Drug use: No  . Sexual activity: Yes    Birth control/protection: Surgical

## 2020-04-24 ENCOUNTER — Encounter: Payer: Self-pay | Admitting: Orthopedic Surgery

## 2020-05-31 ENCOUNTER — Encounter: Payer: Self-pay | Admitting: Orthopedic Surgery

## 2020-06-10 ENCOUNTER — Other Ambulatory Visit: Payer: Self-pay | Admitting: Surgical

## 2020-06-12 ENCOUNTER — Encounter: Payer: Self-pay | Admitting: Orthopedic Surgery

## 2020-06-12 NOTE — Telephone Encounter (Signed)
Pls advise.  

## 2020-06-13 ENCOUNTER — Encounter: Payer: Self-pay | Admitting: Orthopedic Surgery

## 2020-06-23 ENCOUNTER — Other Ambulatory Visit: Payer: Self-pay | Admitting: Surgical

## 2020-06-29 ENCOUNTER — Encounter: Payer: Self-pay | Admitting: Orthopedic Surgery

## 2020-07-05 ENCOUNTER — Encounter: Payer: Self-pay | Admitting: Orthopedic Surgery

## 2020-07-12 NOTE — Telephone Encounter (Signed)
Beverly Salazar with [facility] will contact pt to schedule appt

## 2020-07-29 ENCOUNTER — Ambulatory Visit (HOSPITAL_BASED_OUTPATIENT_CLINIC_OR_DEPARTMENT_OTHER)
Admission: RE | Admit: 2020-07-29 | Discharge: 2020-07-29 | Disposition: A | Discharge status: Home / Self Care | Source: Ambulatory Visit | Attending: Orthopedic Surgery | Admitting: Orthopedic Surgery

## 2020-07-29 ENCOUNTER — Other Ambulatory Visit: Payer: Self-pay

## 2020-07-29 DIAGNOSIS — M79605 Pain in left leg: Secondary | ICD-10-CM | POA: Insufficient documentation

## 2020-08-09 ENCOUNTER — Ambulatory Visit (INDEPENDENT_AMBULATORY_CARE_PROVIDER_SITE_OTHER): Payer: Federal, State, Local not specified - PPO | Admitting: Orthopedic Surgery

## 2020-08-09 ENCOUNTER — Other Ambulatory Visit: Payer: Self-pay

## 2020-08-09 DIAGNOSIS — G8929 Other chronic pain: Secondary | ICD-10-CM | POA: Diagnosis not present

## 2020-08-09 DIAGNOSIS — M5442 Lumbago with sciatica, left side: Secondary | ICD-10-CM

## 2020-08-13 ENCOUNTER — Encounter: Payer: Self-pay | Admitting: Orthopedic Surgery

## 2020-08-13 NOTE — Progress Notes (Signed)
Office Visit Note   Patient: Beverly Salazar           Date of Birth: 11-07-1965           MRN: [id number] Visit Date: 08/09/2020 Requested by: Zoila Shutter, MD [address] [place],  [place] [address] PCP: Zoila Shutter, MD  Subjective: Chief Complaint  Patient presents with  . Other    Review MRI lumbar spine    HPI: Beverly Salazar is a 55 y.o. female who presents to the office complaining of low back pain and radicular left leg pain.  She returns to the office to review lumbar spine MRI.  She has moderate lumbar back pain that has been ongoing for several months and seems to be getting worse.  Pain radiates down the left leg sometimes to the ankle.  Denies any groin pain.  Denies any weakness of the left leg.  Denies any loss of bowel or bladder control..                ROS: All systems reviewed are negative as they relate to the chief complaint within the history of present illness.  Patient denies fevers or chills.  Assessment & Plan: Visit Diagnoses:  1. Chronic midline low back pain with left-sided sciatica     Plan: Patient is a 55 year old female presents complaining of low back pain and radicular left leg pain.  She returns to discuss MRI results.  MRI of the lumbar spine reviewed with her today.  The MRI revealed disc bulging and facet arthritis at L2-L3 and below.  Moderate right foraminal narrowing at L2-L3 with early spinal stenosis at L4-L5.  There is some mild foraminal narrowing on the left at L2-L3 and L4-L5 that may be responsible for her symptoms.  MRI also revealed cholelithiasis which was discussed with patient and she denies any symptoms of abdominal pain, bloating, nausea after fatty meals.  Plan to refer patient to Dr. Naaman Plummer for evaluation and lumbar spine epidural steroid injections.  Patient agreed with plan.  Follow-up as needed.  Follow-Up Instructions: No follow-ups on file.   Orders:  Orders Placed This Encounter   Procedures  . Ambulatory referral to Physical Medicine Rehab   No orders of the defined types were placed in this encounter.     Procedures: No procedures performed   Clinical Data: No additional findings.  Objective: Vital Signs: LMP  (LMP Unknown)   Physical Exam:  Constitutional: Patient appears well-developed HEENT:  Head: Normocephalic Eyes:EOM are normal Neck: Normal range of motion Cardiovascular: Normal rate Pulmonary/chest: Effort normal Neurologic: Patient is alert Skin: Skin is warm Psychiatric: Patient has normal mood and affect  Ortho Exam: Ortho exam demonstrates left knee with well-healed incision from prior surgery.  0 degrees of extension and 95 degrees of flexion.  No calf tenderness.  Tenderness throughout the axial lumbar spine.  No clonus noted.  5/5 motor strength of bilateral hip flexor, quadricep, hamstring, dorsiflexion, plantarflexion.  Specialty Comments:  No specialty comments available.  Imaging: No results found.   PMFS History: Patient Active Problem List   Diagnosis Date Noted  . S/P total knee arthroplasty, left 01/25/2020  . Arthritis of knee 12/22/2018  . Unilateral primary osteoarthritis, right knee 02/05/2017   Past Medical History:  Diagnosis Date  . Anemia    history of  . Anxiety   . Arthritis   . Depression   . DVT (deep venous thrombosis) (HCC)  Right calf, has had DVT's twice  . Dyspnea    upon exertion  . GERD (gastroesophageal reflux disease)   . Hypertension   . Pre-diabetes     No family history on file.  Past Surgical History:  Procedure Laterality Date  . ABDOMINAL HYSTERECTOMY    . KNEE ARTHROSCOPY     right 2012, left 2018  . TOTAL KNEE ARTHROPLASTY Right 12/22/2018   Procedure: RIGHT TOTAL KNEE ARTHROPLASTY;  Surgeon: Cammy Copa, MD;  Location: [facility] OR;  Service: Orthopedics;  Laterality: Right;  . TOTAL KNEE ARTHROPLASTY Left 01/25/2020   Procedure: LEFT TOTAL KNEE ARTHROPLASTY;   Surgeon: Cammy Copa, MD;  Location: [facility] ORS;  Service: Orthopedics;  Laterality: Left;  . WISDOM TOOTH EXTRACTION     Social History   Occupational History  . Not on file  Tobacco Use  . Smoking status: Never Smoker  . Smokeless tobacco: Never Used  Vaping Use  . Vaping Use: Never used  Substance and Sexual Activity  . Alcohol use: No  . Drug use: No  . Sexual activity: Yes    Birth control/protection: Surgical

## 2020-09-07 ENCOUNTER — Ambulatory Visit: Payer: Federal, State, Local not specified - PPO | Admitting: Physical Medicine and Rehabilitation

## 2020-09-30 ENCOUNTER — Other Ambulatory Visit: Payer: Self-pay | Admitting: Surgical

## 2020-10-30 ENCOUNTER — Other Ambulatory Visit: Payer: Self-pay | Admitting: Surgical

## 2020-10-30 NOTE — Telephone Encounter (Signed)
Please advise 

## 2020-11-27 ENCOUNTER — Other Ambulatory Visit: Payer: Self-pay | Admitting: Surgical

## 2020-12-23 ENCOUNTER — Other Ambulatory Visit: Payer: Self-pay | Admitting: Orthopedic Surgery

## 2020-12-24 ENCOUNTER — Other Ambulatory Visit: Payer: Self-pay | Admitting: Orthopedic Surgery

## 2020-12-25 ENCOUNTER — Other Ambulatory Visit: Payer: Self-pay | Admitting: Orthopedic Surgery

## 2020-12-26 ENCOUNTER — Encounter: Payer: Self-pay | Admitting: Orthopedic Surgery

## 2020-12-27 ENCOUNTER — Other Ambulatory Visit: Payer: Self-pay | Admitting: Surgical

## 2020-12-27 MED ORDER — TRAMADOL HCL 50 MG PO TABS: 50.0000 mg | ORAL_TABLET | Freq: Two times a day (BID) | ORAL | 0 refills | Status: AC | PRN

## 2020-12-27 NOTE — Telephone Encounter (Signed)
Sent in RX for tramadol. Looks like she never got set up for ESI with dr. Alvester Morin, that would be a good idea

## 2021-02-06 ENCOUNTER — Other Ambulatory Visit: Payer: Self-pay | Admitting: Surgical

## 2021-02-06 ENCOUNTER — Encounter: Payer: Self-pay | Admitting: Orthopedic Surgery

## 2021-02-06 ENCOUNTER — Other Ambulatory Visit: Payer: Self-pay | Admitting: Orthopedic Surgery

## 2021-02-07 ENCOUNTER — Ambulatory Visit: Payer: Federal, State, Local not specified - PPO | Admitting: Orthopedic Surgery

## 2021-02-08 ENCOUNTER — Other Ambulatory Visit: Payer: Self-pay | Admitting: Surgical

## 2021-02-08 MED ORDER — CELECOXIB 100 MG PO CAPS: 100.0000 mg | ORAL_CAPSULE | Freq: Two times a day (BID) | ORAL | 0 refills | Status: AC

## 2021-02-08 NOTE — Telephone Encounter (Signed)
Refusing as she hasnt been seen recently and long-term opioids are not a great idea

## 2021-02-21 ENCOUNTER — Encounter: Payer: Self-pay | Admitting: Orthopedic Surgery

## 2021-04-13 ENCOUNTER — Other Ambulatory Visit: Payer: Self-pay | Admitting: Surgical

## 2021-05-22 ENCOUNTER — Other Ambulatory Visit: Payer: Self-pay | Admitting: Surgical

## 2021-05-25 ENCOUNTER — Other Ambulatory Visit: Payer: Self-pay | Admitting: Orthopedic Surgery

## 2021-11-16 ENCOUNTER — Encounter: Payer: Self-pay | Admitting: Surgical

## 2021-11-16 ENCOUNTER — Ambulatory Visit (INDEPENDENT_AMBULATORY_CARE_PROVIDER_SITE_OTHER): Admitting: Surgical

## 2021-11-16 DIAGNOSIS — M79605 Pain in left leg: Secondary | ICD-10-CM

## 2021-11-16 DIAGNOSIS — R29898 Other symptoms and signs involving the musculoskeletal system: Secondary | ICD-10-CM | POA: Diagnosis not present

## 2021-11-16 MED ORDER — GABAPENTIN 300 MG PO CAPS: 300.0000 mg | ORAL_CAPSULE | Freq: Two times a day (BID) | ORAL | 0 refills | Status: DC

## 2021-11-16 NOTE — Progress Notes (Addendum)
Office Visit Note   Patient: Beverly Salazar           Date of Birth: 12/19/1965           MRN: [id number] Visit Date: 11/16/2021 Requested by: Zoila Shutter, MD [address] [place],  [place] [address] PCP: Zoila Shutter, MD  Subjective: Chief Complaint  Patient presents with   Left Leg - Pain    HPI: Beverly Salazar is a 56 y.o. female who presents to the office complaining of left leg pain.  Patient complains that she has been experiencing increased pain since December.  Pain initially began at night behind the side of her knee on the lateral side.  She started to notice more constant pain in March.  She localizes the majority of her pain on the dorsum of her left foot as well as the posterior and lateral calf.  She does feel that the entire left leg hurts her at night.  No groin pain.  No significant increase in her chronic low back pain.  She feels weakness in her leg pretty much every day and has noticed that her foot drags along the floor and makes slapping sensation occasionally when she is walking.  She has a burning sensation in the left foot with associated numbness but no tingling.  She takes Tylenol with some relief but it does not last.  She has history of prior MRI scan of the lumbar spine that showed mild foraminal narrowing on the left at L2-L3 and L4-L5.                ROS: All systems reviewed are negative as they relate to the chief complaint within the history of present illness.  Patient denies fevers or chills.  Assessment & Plan: Visit Diagnoses:  1. Ankle weakness   2. Pain in left leg     Plan: Patient is a 56 year old female who presents complaining of left leg pain.  She has burning and numbness sensation in the dorsum of the left foot with no history of injury.  She has significant weakness of dorsiflexion of the left ankle and has noticed slap foot gait at times.  With not a lot of compelling left-sided findings on MRI of the lumbar  spine and no significant increase in her chronic low back pain, plan for further evaluation of potential peroneal nerve compression with nerve conduction study of the left leg.  Plan to refer to Dr. Alvester Morin.  Follow-up after study to review results.  Follow-Up Instructions: No follow-ups on file.   Orders:  Orders Placed This Encounter  Procedures   Ambulatory referral to Physical Medicine Rehab   Meds ordered this encounter  Medications   gabapentin (NEURONTIN) 300 MG capsule    Sig: Take 1 capsule (300 mg total) by mouth 2 (two) times daily.    Dispense:  60 capsule    Refill:  0      Procedures: No procedures performed   Clinical Data: No additional findings.  Objective: Vital Signs: LMP  (LMP Unknown)   Physical Exam:  Constitutional: Patient appears well-developed HEENT:  Head: Normocephalic Eyes:EOM are normal Neck: Normal range of motion Cardiovascular: Normal rate Pulmonary/chest: Effort normal Neurologic: Patient is alert Skin: Skin is warm Psychiatric: Patient has normal mood and affect  Ortho Exam: Ortho exam demonstrates 1+ DP pulse of the bilateral lower extremities.  No calf tenderness.  Negative Homans' sign.  She has excellent hip flexion strength to  5/5 of the right leg with 5/5 motor strength of quad, hamstring, dorsiflexion, plantarflexion, EHL.  On the left leg, she has 3/5 weakness of dorsiflexion of the left ankle with 5/5 motor strength of plantarflexion, hamstring, quadricep.  5 -/5 strength of EHL and hip flexion.  No swelling or bruising noted in the extremity.  Specialty Comments:  No specialty comments available.  Imaging: No results found.   PMFS History: Patient Active Problem List   Diagnosis Date Noted   S/P total knee arthroplasty, left 01/25/2020   Arthritis of knee 12/22/2018   Unilateral primary osteoarthritis, right knee 02/05/2017   Past Medical History:  Diagnosis Date   Anemia    history of   Anxiety    Arthritis     Depression    DVT (deep venous thrombosis) (HCC)    Right calf, has had DVT's twice   Dyspnea    upon exertion   GERD (gastroesophageal reflux disease)    Hypertension    Pre-diabetes     No family history on file.  Past Surgical History:  Procedure Laterality Date   ABDOMINAL HYSTERECTOMY     KNEE ARTHROSCOPY     right 2012, left 2018   TOTAL KNEE ARTHROPLASTY Right 12/22/2018   Procedure: RIGHT TOTAL KNEE ARTHROPLASTY;  Surgeon: Cammy Copa, MD;  Location: [facility] OR;  Service: Orthopedics;  Laterality: Right;   TOTAL KNEE ARTHROPLASTY Left 01/25/2020   Procedure: LEFT TOTAL KNEE ARTHROPLASTY;  Surgeon: Cammy Copa, MD;  Location: [facility] ORS;  Service: Orthopedics;  Laterality: Left;   WISDOM TOOTH EXTRACTION     Social History   Occupational History   Not on file  Tobacco Use   Smoking status: Never   Smokeless tobacco: Never  Vaping Use   Vaping Use: Never used  Substance and Sexual Activity   Alcohol use: No   Drug use: No   Sexual activity: Yes    Birth control/protection: Surgical

## 2021-11-28 ENCOUNTER — Ambulatory Visit: Payer: Federal, State, Local not specified - PPO | Admitting: Orthopedic Surgery

## 2021-12-12 ENCOUNTER — Other Ambulatory Visit: Payer: Self-pay | Admitting: Surgical

## 2021-12-19 ENCOUNTER — Encounter: Payer: Self-pay | Admitting: Physical Medicine and Rehabilitation

## 2021-12-19 ENCOUNTER — Ambulatory Visit (INDEPENDENT_AMBULATORY_CARE_PROVIDER_SITE_OTHER): Payer: Self-pay | Admitting: Physical Medicine and Rehabilitation

## 2021-12-19 DIAGNOSIS — R202 Paresthesia of skin: Secondary | ICD-10-CM

## 2021-12-19 NOTE — Progress Notes (Signed)
Pt state she has pain going down her left leg and top of her foot. Pt state walking and laying down makes the pain worse. Pt state she takes pain meds to help ease her pain.  Numeric Pain Rating Scale and Functional Assessment Average Pain 5   In the last MONTH (on 0-10 scale) has pain interfered with the following?  1. General activity like being  able to carry out your everyday physical activities such as walking, climbing stairs, carrying groceries, or moving a chair?  Rating(10)    -BT, -Dye Allergies.

## 2021-12-20 ENCOUNTER — Other Ambulatory Visit: Payer: Self-pay | Admitting: Surgical

## 2021-12-25 NOTE — Procedures (Signed)
EMG & NCV Findings: Evaluation of the left tibial motor nerve showed reduced amplitude (2.4 mV).  All remaining nerves (as indicated in the following tables) were within normal limits.    Needle evaluation of the left anterior tibialis and the left Fibularis Longus muscles showed increased insertional activity.  All remaining muscles (as indicated in the following table) showed no evidence of electrical instability.    Impression: Essentially NORMAL electrodiagnostic study of the left lower limb.  There is no significant electrodiagnostic evidence of nerve entrapment, lumbosacral plexopathy or lumbar radiculopathy.  **As you know, purely sensory or demyelinating radiculopathies and chemical radiculitis may not be detected with this particular electrodiagnostic study.  Recommendations: 1.  Follow-up with referring physician. 2.  Continue current management of symptoms. 3.  Suggest trial L5-S1 epidural. ___________________________ Elease Hashimoto Board Certified, American Board of Physical Medicine and Rehabilitation  ___________________________ [facility] Board Certified, American Board of Physical Medicine and Rehabilitation    Nerve Conduction Studies Anti Sensory Summary Table   Stim Site NR Peak (ms) Norm Peak (ms) P-T Amp (V) Norm P-T Amp Site1 Site2 Delta-P (ms) Dist (cm) Vel (m/s) Norm Vel (m/s)  Left Saphenous Anti Sensory (Ant Med Mall)  28.1C  14cm    4.2 <4.4 14.4 >2 14cm Ant Med Mall 4.2 0.0  >32  Left Sup Fibular Anti Sensory (Ant Lat Mall)  28.1C  14 cm    4.2 <4.4 11.8 >5.0 14 cm Ant Lat Mall 4.2 14.0 33 >32  Left Sural Anti Sensory (Lat Mall)  28.2C  Calf    2.6 <4.0 7.0 >5.0 Calf Lat Mall 2.6 14.0 54 >35   Motor Summary Table   Stim Site NR Onset (ms) Norm Onset (ms) O-P Amp (mV) Norm O-P Amp Site1 Site2 Delta-0 (ms) Dist (cm) Vel (m/s) Norm Vel (m/s)  Left Fibular Motor (Ext Dig Brev)  28C  Ankle    4.1 <6.1 2.6 >2.5 B Fib Ankle 7.3 34.0 47 >38   B Fib    11.4  3.7  Poplt B Fib 2.0 8.0 40 >40  Poplt    13.4  3.6         Left Tibial Motor (Abd Hall Brev)  28C  Ankle    5.9 <6.1 *2.4 >3.0 Knee Ankle 6.9 38.0 55 >35  Knee    12.8  2.1          EMG   Side Muscle Nerve Root Ins Act Fibs Psw Amp Dur Poly Recrt Int Dennie Bible Comment  Left AntTibialis Dp Br Peron L4-5 *Incr Nml Nml Nml Nml 0 Nml Nml   Left Fibularis Longus  Sup Br Peron L5-S1 *Incr Nml Nml Nml Nml 0 Nml Nml   Left MedGastroc Tibial S1-2 Nml Nml Nml Nml Nml 0 Nml Nml   Left VastusMed Femoral L2-4 Nml Nml Nml Nml Nml 0 Nml Nml   Left BicepsFemS Sciatic L5-S1 Nml Nml Nml Nml Nml 0 Nml Nml     Nerve Conduction Studies Anti Sensory Left/Right Comparison   Stim Site L Lat (ms) R Lat (ms) L-R Lat (ms) L Amp (V) R Amp (V) L-R Amp (%) Site1 Site2 L Vel (m/s) R Vel (m/s) L-R Vel (m/s)  Saphenous Anti Sensory (Ant Med Mall)  28.1C  14cm 4.2   14.4   14cm Ant Med Mall     Sup Fibular Anti Sensory (Ant Lat Mall)  28.1C  14 cm 4.2   11.8   14 cm Ant Lat Mall 33  Sural Anti Sensory (Lat Mall)  28.2C  Calf 2.6   7.0   Calf Lat Mall 54     Motor Left/Right Comparison   Stim Site L Lat (ms) R Lat (ms) L-R Lat (ms) L Amp (mV) R Amp (mV) L-R Amp (%) Site1 Site2 L Vel (m/s) R Vel (m/s) L-R Vel (m/s)  Fibular Motor (Ext Dig Brev)  28C  Ankle 4.1   2.6   B Fib Ankle 47    B Fib 11.4   3.7   Poplt B Fib 40    Poplt 13.4   3.6         Tibial Motor (Abd Hall Brev)  28C  Ankle 5.9   *2.4   Knee Ankle 55    Knee 12.8   2.1            Waveforms:

## 2021-12-25 NOTE — Progress Notes (Addendum)
Beverly Salazar - 56 y.o. female MRN [id number]  Date of birth: 1965-07-02  Office Visit Note: Visit Date: 12/19/2021 PCP: Beverly Shutter, MD Referred by: Beverly Salazar  Subjective: Chief Complaint  Patient presents with   Left Leg - Pain   Left Foot - Pain   HPI:  Beverly Salazar is a 56 y.o. female who comes in today at the request of Beverly Cai, PA-C for electrodiagnostic study of the Left lower extremity. Patient complains that she has been experiencing increased pain and dysesthesia since December.  Pain initially began at night behind the side of her knee on the lateral side.  She started to notice more constant pain in March.  She localizes the majority of her pain and tingling on the dorsum of her left foot as well as the posterior and lateral calf.  She does feel that the entire left leg hurts her at night.  She has had prior electrodiagnostic study of the upper extremity in our office in 2019 showing mild carpal tunnel syndrome.  She has not had electrodiagnostic study of the lower limb.  She has had MRI of the lumbar spine in 2018, 2019 and 2022.  Latest MRI of the lumbar spine shows fairly mild degenerative changes with age with some mild narrowing but no high-grade stenosis or nerve compression or left-sided pathology.  She has had no prior history of lumbar epidural injections.  She is status post left total knee arthroplasty.   ROS Otherwise per HPI.  Assessment & Plan: Visit Diagnoses:    ICD-10-CM   1. Paresthesia of skin  R20.2 NCV with EMG (electromyography)      Plan: Impression: Essentially NORMAL electrodiagnostic study of the left lower limb.  There is no significant electrodiagnostic evidence of nerve entrapment, lumbosacral plexopathy or lumbar radiculopathy.  **As you know, purely sensory or demyelinating radiculopathies and chemical radiculitis may not be detected with this particular electrodiagnostic study.  Recommendations: 1.  Follow-up with  referring physician. 2.  Continue current management of symptoms. 3.  Suggest trial L5-S1 epidural.  Meds & Orders: No orders of the defined types were placed in this encounter.   Orders Placed This Encounter  Procedures   NCV with EMG (electromyography)    Follow-up: Return in about 2 weeks (around 01/02/2022) for [employer], PA-C.   Procedures: No procedures performed  EMG & NCV Findings: Evaluation of the left tibial motor nerve showed reduced amplitude (2.4 mV).  All remaining nerves (as indicated in the following tables) were within normal limits.    Needle evaluation of the left anterior tibialis and the left Fibularis Longus muscles showed increased insertional activity.  All remaining muscles (as indicated in the following table) showed no evidence of electrical instability.    Impression: Essentially NORMAL electrodiagnostic study of the left lower limb.  There is no significant electrodiagnostic evidence of nerve entrapment, lumbosacral plexopathy or lumbar radiculopathy.  **As you know, purely sensory or demyelinating radiculopathies and chemical radiculitis may not be detected with this particular electrodiagnostic study.  Recommendations: 1.  Follow-up with referring physician. 2.  Continue current management of symptoms. 3.  Suggest trial L5-S1 epidural. ___________________________ Beverly Salazar Board Certified, American Board of Physical Medicine and Rehabilitation  ___________________________ Beverly Salazar Board Certified, American Board of Physical Medicine and Rehabilitation    Nerve Conduction Studies Anti Sensory Summary Table   Stim Site NR Peak (ms) Norm Peak (ms) P-T Amp (V) Norm P-T Amp Site1 Site2 Delta-P (  ms) Dist (cm) Vel (m/s) Norm Vel (m/s)  Left Saphenous Anti Sensory (Ant Med Mall)  28.1C  14cm    4.2 <4.4 14.4 >2 14cm Ant Med Mall 4.2 0.0  >32  Left Sup Fibular Anti Sensory (Ant Lat Mall)  28.1C  14 cm    4.2 <4.4 11.8 >5.0 14  cm Ant Lat Mall 4.2 14.0 33 >32  Left Sural Anti Sensory (Lat Mall)  28.2C  Calf    2.6 <4.0 7.0 >5.0 Calf Lat Mall 2.6 14.0 54 >35   Motor Summary Table   Stim Site NR Onset (ms) Norm Onset (ms) O-P Amp (mV) Norm O-P Amp Site1 Site2 Delta-0 (ms) Dist (cm) Vel (m/s) Norm Vel (m/s)  Left Fibular Motor (Ext Dig Brev)  28C  Ankle    4.1 <6.1 2.6 >2.5 B Fib Ankle 7.3 34.0 47 >38  B Fib    11.4  3.7  Poplt B Fib 2.0 8.0 40 >40  Poplt    13.4  3.6         Left Tibial Motor (Abd Hall Brev)  28C  Ankle    5.9 <6.1 *2.4 >3.0 Knee Ankle 6.9 38.0 55 >35  Knee    12.8  2.1          EMG   Side Muscle Nerve Root Ins Act Fibs Psw Amp Dur Poly Recrt Int Dennie Bible Comment  Left AntTibialis Dp Br Peron L4-5 *Incr Nml Nml Nml Nml 0 Nml Nml   Left Fibularis Longus  Sup Br Peron L5-S1 *Incr Nml Nml Nml Nml 0 Nml Nml   Left MedGastroc Tibial S1-2 Nml Nml Nml Nml Nml 0 Nml Nml   Left VastusMed Femoral L2-4 Nml Nml Nml Nml Nml 0 Nml Nml   Left BicepsFemS Sciatic L5-S1 Nml Nml Nml Nml Nml 0 Nml Nml     Nerve Conduction Studies Anti Sensory Left/Right Comparison   Stim Site L Lat (ms) R Lat (ms) L-R Lat (ms) L Amp (V) R Amp (V) L-R Amp (%) Site1 Site2 L Vel (m/s) R Vel (m/s) L-R Vel (m/s)  Saphenous Anti Sensory (Ant Med Mall)  28.1C  14cm 4.2   14.4   14cm Ant Med Mall     Sup Fibular Anti Sensory (Ant Lat Mall)  28.1C  14 cm 4.2   11.8   14 cm Ant Lat Mall 33    Sural Anti Sensory (Lat Mall)  28.2C  Calf 2.6   7.0   Calf Lat Mall 54     Motor Left/Right Comparison   Stim Site L Lat (ms) R Lat (ms) L-R Lat (ms) L Amp (mV) R Amp (mV) L-R Amp (%) Site1 Site2 L Vel (m/s) R Vel (m/s) L-R Vel (m/s)  Fibular Motor (Ext Dig Brev)  28C  Ankle 4.1   2.6   B Fib Ankle 47    B Fib 11.4   3.7   Poplt B Fib 40    Poplt 13.4   3.6         Tibial Motor (Abd Hall Brev)  28C  Ankle 5.9   *2.4   Knee Ankle 55    Knee 12.8   2.1            Waveforms:             Clinical History: No specialty  comments available.     Objective:  VS:  HT:    WT:   BMI:     BP:  HR: bpm  TEMP: ( )  RESP:  Physical Exam Vitals and nursing note reviewed.  Constitutional:      General: She is not in acute distress.    Appearance: Normal appearance. She is not ill-appearing.  HENT:     Head: Normocephalic and atraumatic.     Right Ear: External ear normal.     Left Ear: External ear normal.  Eyes:     Extraocular Movements: Extraocular movements intact.  Cardiovascular:     Rate and Rhythm: Normal rate.     Pulses: Normal pulses.  Pulmonary:     Effort: Pulmonary effort is normal. No respiratory distress.  Abdominal:     General: There is no distension.     Palpations: Abdomen is soft.  Musculoskeletal:        General: Tenderness present.     Cervical back: Neck supple.     Right lower leg: No edema.     Left lower leg: No edema.     Comments: Patient has good distal strength with no pain over the greater trochanters.  No clonus or focal weakness.  She has some dysesthesia in more of an L5 dermatome on the left but good sensation intact.  No swelling or color change or temperature change.  No atrophy noted.  Skin:    Findings: No erythema, lesion or rash.  Neurological:     General: No focal deficit present.     Mental Status: She is alert and oriented to person, place, and time.     Sensory: No sensory deficit.     Motor: No weakness or abnormal muscle tone.     Coordination: Coordination normal.  Psychiatric:        Mood and Affect: Mood normal.        Behavior: Behavior normal.      Imaging: No results found.

## 2022-01-02 ENCOUNTER — Telehealth: Payer: Self-pay | Admitting: Orthopedic Surgery

## 2022-01-02 DIAGNOSIS — G8929 Other chronic pain: Secondary | ICD-10-CM

## 2022-01-02 NOTE — Telephone Encounter (Signed)
I called pt to verify insurance. Pt states she was suppose to make a follow up with August Saucer but forgot and now  does not have anything until 08/16. She will be out of town so she is wondering if she can get a call with the results?

## 2022-01-02 NOTE — Telephone Encounter (Signed)
I called and discussed results with her over the phone.  Please set her up to get lumbar epidural steroid injections in with Dr. Carmon Ginsberg n thx

## 2022-01-03 NOTE — Addendum Note (Signed)
Addended byPrescott Parma on: 01/03/2022 08:25 AM   Modules accepted: Orders

## 2022-01-23 ENCOUNTER — Ambulatory Visit: Payer: Federal, State, Local not specified - PPO | Admitting: Orthopedic Surgery

## 2022-01-28 ENCOUNTER — Other Ambulatory Visit: Payer: Self-pay | Admitting: Surgical

## 2022-01-30 ENCOUNTER — Encounter: Payer: Self-pay | Admitting: Orthopedic Surgery

## 2022-01-30 ENCOUNTER — Ambulatory Visit: Payer: Federal, State, Local not specified - PPO | Admitting: Orthopedic Surgery

## 2022-01-30 DIAGNOSIS — M5442 Lumbago with sciatica, left side: Secondary | ICD-10-CM

## 2022-01-30 DIAGNOSIS — G8929 Other chronic pain: Secondary | ICD-10-CM

## 2022-01-30 NOTE — Progress Notes (Signed)
Office Visit Note   Patient: Beverly Salazar           Date of Birth: 02/08/1966           MRN: [id number] Visit Date: 01/30/2022 Requested by: Zoila Shutter, MD [address] [place],  [place] [address] PCP: Zoila Shutter, MD  Subjective: Chief Complaint  Patient presents with   Left Leg - Pain   Right Elbow - Pain    HPI: Beverly Salazar is a 56 year old patient with left leg and foot pain.  She also reports right elbow and forearm pain.  Regarding the left leg and foot she has had an EMG nerve study which is reviewed.  That was normal.  Recommendation from Dr. Alvester Morin at that time was to consider diagnostic and therapeutic L5-S1 foraminal injection.  She wants to hold off on that intervention.  She does report pain in the calf radiating down into the foot and dorsal ankle region.  She also reports right elbow and forearm pain on the medial side for 2 weeks duration.  Denies any history of injury.  By [facility]  She cannot take anti-inflammatories.  Does use gabapentin and Tylenol.  Does hurt her with reaching and grasping activities.  She is using a brace which helps some.              ROS: All systems reviewed are negative as they relate to the chief complaint within the history of present illness.  Patient denies  fevers or chills.   Assessment & Plan: Visit Diagnoses:  1. Chronic midline low back pain with left-sided sciatica     Plan: Impression is left leg and foot pain which still looks more like neuropathy than anything else.  No nerve compression around the knee.  I think Dr. Chester Hill Blas recommendation of diagnostic and therapeutic L5-S1 left-sided foraminal injection is good for diagnostic and therapeutic purposes.  She wants to hold off on that for now.  No other intervention indicated at this time.  Her left total knee replacement is functioning well.  Regarding her forearm I think she does have some medial common flexor  origin tendinosis.  Talked about cardiovascular exercise for that along with continuation of the brace topical anti-inflammatories as well as eccentric strengthening.  She is going to consider all his options and follow-up as needed  Follow-Up Instructions: Return if symptoms worsen or fail to improve.   Orders:  No orders of the defined types were placed in this encounter.  No orders of the defined types were placed in this encounter.     Procedures: No procedures performed   Clinical Data: No additional findings.  Objective: Vital Signs: LMP  (LMP Unknown)   Physical Exam:   Constitutional: Patient appears well-developed HEENT:  Head: Normocephalic Eyes:EOM are normal Neck: Normal range of motion Cardiovascular: Normal rate Pulmonary/chest: Effort normal Neurologic: Patient is alert Skin: Skin is warm Psychiatric: Patient has normal mood and affect   Ortho Exam: Ortho exam demonstrates full active and passive range of motion of both feet and ankles.  Palpable intact nontender anterior to posterior and peroneal and Achilles tendons.  No definite paresthesias L1 S1 bilaterally.  Does have a little bit of pain in the calf region but no Homans present.  Strength is good in the ankles.  No pain with pronation from supination of the forefoot on the left.  No nerve root tension signs.  Right arm is examined.  Full range of motion the elbow is present.  No subluxation of the ulnar nerve negative Tinel's in the ulnar nerve.  Does have some pain with resisted wrist flexion and pronation of the forearm.  No lateral sided tenderness is present.  Elbow range of motion is full.  Specialty Comments:  MRI LUMBAR SPINE WITHOUT CONTRAST   TECHNIQUE: Multiplanar, multisequence MR imaging of the lumbar spine was performed. No intravenous contrast was administered.   COMPARISON:  05/26/2018   FINDINGS: Segmentation:  5 lumbar type vertebrae   Alignment:  Physiologic.   Vertebrae:  Marrow edema associated with the right L2-3 facet. No fracture, discitis, or aggressive bone lesion   Conus medullaris and cauda equina: Conus extends to the L1 level. Conus and cauda equina appear normal.   Paraspinal and other soft tissues: A large gallstone is noted.   Disc levels:   T12- L1: Unremarkable.   L1-L2: Unremarkable.   L2-L3: Disc narrowing and desiccation with circumferential bulging. Facet osteoarthritis with spurring and left-sided joint effusion. Moderate right and mild left foraminal narrowing   L3-L4: Degenerative facet spurring. Mild disc narrowing with circumferential bulge. No neural compression   L4-L5: Disc narrowing and bulging. Mild to moderate degenerative facet spurring with ligamentum flavum thickening. Mild triangular narrowing of the thecal sac.   L5-S1:Mild facet spurring. Mild disc narrowing. No neural compression.   IMPRESSION: 1. Disc bulging and facet osteoarthritis at L2-3 and below. Active facet arthritis with marrow edema on the right at L2-3. 2. L2-3 moderate right foraminal narrowing. No high-grade or compressive left-sided narrowing. 3. Early spinal stenosis at L4-5. 4. Cholelithiasis.     Electronically Signed   By: Marnee Spring M.D.   On: 07/30/2020 06:03  Imaging: No results found.   PMFS History: Patient Active Problem List   Diagnosis Date Noted   S/P total knee arthroplasty, left 01/25/2020   Arthritis of knee 12/22/2018   Unilateral primary osteoarthritis, right knee 02/05/2017   Past Medical History:  Diagnosis Date   Anemia    history of   Anxiety    Arthritis    Depression    DVT (deep venous thrombosis) (HCC)    Right calf, has had DVT's twice   Dyspnea    upon exertion   GERD (gastroesophageal reflux disease)    Hypertension    Pre-diabetes     History reviewed. No pertinent family history.  Past Surgical History:  Procedure Laterality Date   ABDOMINAL HYSTERECTOMY     KNEE ARTHROSCOPY      right 2012, left 2018   TOTAL KNEE ARTHROPLASTY Right 12/22/2018   Procedure: RIGHT TOTAL KNEE ARTHROPLASTY;  Surgeon: Cammy Copa, MD;  Location: [facility] OR;  Service: Orthopedics;  Laterality: Right;   TOTAL KNEE ARTHROPLASTY Left 01/25/2020   Procedure: LEFT TOTAL KNEE ARTHROPLASTY;  Surgeon: Cammy Copa, MD;  Location: [facility] ORS;  Service: Orthopedics;  Laterality: Left;   WISDOM TOOTH EXTRACTION     Social History   Occupational History   Not on file  Tobacco Use   Smoking status: Never   Smokeless tobacco: Never  Vaping Use   Vaping Use: Never used  Substance and Sexual Activity   Alcohol use: No   Drug use: No   Sexual activity: Yes    Birth control/protection: Surgical

## 2022-01-31 NOTE — Telephone Encounter (Signed)
Okay for 5 years.  Thanks

## 2022-02-23 ENCOUNTER — Other Ambulatory Visit: Payer: Self-pay | Admitting: Surgical

## 2022-02-26 ENCOUNTER — Other Ambulatory Visit: Payer: Self-pay | Admitting: Surgical

## 2022-02-26 MED ORDER — GABAPENTIN 300 MG PO CAPS: 300.0000 mg | ORAL_CAPSULE | Freq: Three times a day (TID) | ORAL | 2 refills | Status: DC

## 2022-03-14 ENCOUNTER — Other Ambulatory Visit: Payer: Self-pay | Admitting: Surgical

## 2022-03-14 NOTE — Telephone Encounter (Signed)
She does not need this on my account

## 2023-02-22 ENCOUNTER — Other Ambulatory Visit: Payer: Self-pay | Admitting: Surgical

## 2023-04-07 ENCOUNTER — Encounter (HOSPITAL_BASED_OUTPATIENT_CLINIC_OR_DEPARTMENT_OTHER): Payer: Self-pay | Admitting: Emergency Medicine

## 2023-04-07 ENCOUNTER — Emergency Department (HOSPITAL_BASED_OUTPATIENT_CLINIC_OR_DEPARTMENT_OTHER)
Admission: EM | Admit: 2023-04-07 | Discharge: 2023-04-08 | Disposition: A | Discharge status: Home / Self Care | Payer: Federal, State, Local not specified - PPO | Attending: Emergency Medicine | Admitting: Emergency Medicine

## 2023-04-07 ENCOUNTER — Other Ambulatory Visit: Payer: Self-pay

## 2023-04-07 ENCOUNTER — Emergency Department (HOSPITAL_BASED_OUTPATIENT_CLINIC_OR_DEPARTMENT_OTHER): Payer: Federal, State, Local not specified - PPO

## 2023-04-07 DIAGNOSIS — Y92410 Unspecified street and highway as the place of occurrence of the external cause: Secondary | ICD-10-CM | POA: Diagnosis not present

## 2023-04-07 DIAGNOSIS — M542 Cervicalgia: Secondary | ICD-10-CM | POA: Insufficient documentation

## 2023-04-07 DIAGNOSIS — R079 Chest pain, unspecified: Secondary | ICD-10-CM

## 2023-04-07 DIAGNOSIS — M545 Low back pain, unspecified: Secondary | ICD-10-CM | POA: Diagnosis not present

## 2023-04-07 DIAGNOSIS — E876 Hypokalemia: Secondary | ICD-10-CM | POA: Diagnosis not present

## 2023-04-07 DIAGNOSIS — Z7982 Long term (current) use of aspirin: Secondary | ICD-10-CM | POA: Diagnosis not present

## 2023-04-07 LAB — CBC WITH DIFFERENTIAL/PLATELET
Abs Immature Granulocytes: 0.01 10*3/uL (ref 0.00–0.07)
Basophils Absolute: 0 10*3/uL (ref 0.0–0.1)
Basophils Relative: 1 %
Eosinophils Absolute: 0.2 10*3/uL (ref 0.0–0.5)
Eosinophils Relative: 3 %
HCT: 34.2 % — ABNORMAL LOW (ref 36.0–46.0)
Hemoglobin: 11.4 g/dL — ABNORMAL LOW (ref 12.0–15.0)
Immature Granulocytes: 0 %
Lymphocytes Relative: 53 %
Lymphs Abs: 2.8 10*3/uL (ref 0.7–4.0)
MCH: 29.4 pg (ref 26.0–34.0)
MCHC: 33.3 g/dL (ref 30.0–36.0)
MCV: 88.1 fL (ref 80.0–100.0)
Monocytes Absolute: 0.4 10*3/uL (ref 0.1–1.0)
Monocytes Relative: 8 %
Neutro Abs: 1.9 10*3/uL (ref 1.7–7.7)
Neutrophils Relative %: 35 %
Platelets: 300 10*3/uL (ref 150–400)
RBC: 3.88 MIL/uL (ref 3.87–5.11)
RDW: 13.5 % (ref 11.5–15.5)
WBC: 5.4 10*3/uL (ref 4.0–10.5)
nRBC: 0 % (ref 0.0–0.2)

## 2023-04-07 LAB — COMPREHENSIVE METABOLIC PANEL
ALT: 26 U/L (ref 0–44)
AST: 31 U/L (ref 15–41)
Albumin: 3.8 g/dL (ref 3.5–5.0)
Alkaline Phosphatase: 61 U/L (ref 38–126)
Anion gap: 9 (ref 5–15)
BUN: 13 mg/dL (ref 6–20)
CO2: 31 mmol/L (ref 22–32)
Calcium: 8.8 mg/dL — ABNORMAL LOW (ref 8.9–10.3)
Chloride: 96 mmol/L — ABNORMAL LOW (ref 98–111)
Creatinine, Ser: 0.81 mg/dL (ref 0.44–1.00)
GFR, Estimated: >60 mL/min (ref >60)
Glucose, Bld: 92 mg/dL (ref 70–99)
Potassium: 2.7 mmol/L — CL (ref 3.5–5.1)
Sodium: 136 mmol/L (ref 135–145)
Total Bilirubin: 0.9 mg/dL (ref 0.3–1.2)
Total Protein: 6.9 g/dL (ref 6.5–8.1)

## 2023-04-07 LAB — MAGNESIUM: Magnesium: 2.3 mg/dL (ref 1.7–2.4)

## 2023-04-07 LAB — TROPONIN I (HIGH SENSITIVITY)
Troponin I (High Sensitivity): 9 ng/L (ref <18)
Troponin I (High Sensitivity): 11 ng/L (ref <18)

## 2023-04-07 MED ORDER — POTASSIUM CHLORIDE 10 MEQ/100ML IV SOLN
10.0000 meq | Freq: Once | INTRAVENOUS | Status: AC
  Administered 2023-04-08: 10 meq via INTRAVENOUS
  Filled 2023-04-07: qty 100

## 2023-04-07 MED ORDER — HYDROCODONE-ACETAMINOPHEN 5-325 MG PO TABS
1.0000 | ORAL_TABLET | Freq: Once | ORAL | Status: AC
  Administered 2023-04-07: 1 via ORAL
  Filled 2023-04-07: qty 1

## 2023-04-07 MED ORDER — SODIUM CHLORIDE 0.9 % IV SOLN: INTRAVENOUS | Status: DC | PRN

## 2023-04-07 MED ORDER — ACETAMINOPHEN 500 MG PO TABS
1000.0000 mg | ORAL_TABLET | Freq: Once | ORAL | Status: AC
  Administered 2023-04-07: 1000 mg via ORAL
  Filled 2023-04-07: qty 2

## 2023-04-07 MED ORDER — POTASSIUM CHLORIDE 10 MEQ/100ML IV SOLN
10.0000 meq | INTRAVENOUS | Status: DC
  Administered 2023-04-07 (×2): 10 meq via INTRAVENOUS
  Filled 2023-04-07 (×2): qty 100

## 2023-04-07 MED ORDER — IOHEXOL 350 MG/ML SOLN
75.0000 mL | Freq: Once | INTRAVENOUS | Status: AC | PRN
  Administered 2023-04-07: 75 mL via INTRAVENOUS

## 2023-04-07 MED ORDER — POTASSIUM CHLORIDE CRYS ER 20 MEQ PO TBCR
40.0000 meq | EXTENDED_RELEASE_TABLET | Freq: Once | ORAL | Status: AC
  Administered 2023-04-07: 40 meq via ORAL
  Filled 2023-04-07: qty 2

## 2023-04-07 NOTE — ED Provider Notes (Signed)
  Physical Exam  BP 117/71 (BP Location: Right Arm)   Pulse (!) 54   Temp 98.2 F (36.8 C) (Oral)   Resp 12   Ht 5\' 8"  (1.727 m)   Wt 95.3 kg   LMP  (LMP Unknown)   SpO2 100%   BMI 31.93 kg/m   Physical Exam Constitutional:      General: She is not in acute distress.    Appearance: She is not ill-appearing, toxic-appearing or diaphoretic.  Cardiovascular:     Rate and Rhythm: Normal rate and regular rhythm.  Pulmonary:     Effort: No respiratory distress.     Breath sounds: No wheezing.  Abdominal:     General: Abdomen is flat. Bowel sounds are normal. There is no distension.     Palpations: Abdomen is soft.     Tenderness: There is no abdominal tenderness.  Musculoskeletal:     Right lower leg: No edema.     Left lower leg: No edema.     Procedures  Procedures  ED Course / MDM    Medical Decision Making Amount and/or Complexity of Data Reviewed Labs: ordered. Radiology: ordered.  Risk OTC drugs. Prescription drug management.  I received patient at shift change from [facility].  Discharge is pending imaging result. Patient needs to follow-up with primary care for low potassium.  Patient also needs to follow-up with cardiology outpatient. Potassium is currently being replenished.  Patient reports she is feeling much better, pain is under control.    CT angio chest PE rule out shows no evidence of pulmonary embolism.  Chest x-ray shows no evidence of acute fracture.  CT cervical spine shows no acute fracture.  CT lumbar spine shows no acute fracture.    Given that patient is hemodynamically stable had normal imaging and pain is under control, will discharge home with recommendations for following up with primary care to get potassium rechecked and restarting potassium medication.  Patient reports that she takes oral potassium at home has not taken in a few days and does not take it regularly.  Patient understands she needs to restart taking potassium  regularly.  Patient will be sent home with pain management strategy.  Patient stable for discharge.  All questions answered.  Patient agrees to plan and understands.    Smitty Knudsen, PA-C 04/08/23 1539    Loetta Rough, MD 04/10/23 [phone]
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
## 2023-04-07 NOTE — ED Notes (Signed)
Pt. Is in radiology at this time.  RN waiting on Pt. To return for EKG and blood work to be done.
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
## 2023-04-08 MED ORDER — NAPROXEN 500 MG PO TABS: 500.0000 mg | ORAL_TABLET | Freq: Two times a day (BID) | ORAL | 0 refills | Status: DC

## 2023-04-08 MED ORDER — METHOCARBAMOL 500 MG PO TABS: 500.0000 mg | ORAL_TABLET | Freq: Two times a day (BID) | ORAL | 0 refills | Status: AC
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
## 2023-07-09 ENCOUNTER — Ambulatory Visit: Payer: Federal, State, Local not specified - PPO | Admitting: Surgical

## 2023-09-04 ENCOUNTER — Other Ambulatory Visit: Payer: Self-pay | Admitting: Surgical

## 2023-12-15 ENCOUNTER — Other Ambulatory Visit: Payer: Self-pay | Admitting: Surgical

## 2023-12-29 ENCOUNTER — Ambulatory Visit: Admitting: Surgical

## 2023-12-29 DIAGNOSIS — G8929 Other chronic pain: Secondary | ICD-10-CM | POA: Diagnosis not present

## 2023-12-29 DIAGNOSIS — M5442 Lumbago with sciatica, left side: Secondary | ICD-10-CM | POA: Diagnosis not present

## 2023-12-31 ENCOUNTER — Encounter: Payer: Self-pay | Admitting: Surgical

## 2023-12-31 NOTE — Progress Notes (Signed)
 Office Visit Note   Patient: Beverly Salazar           Date of Birth: Apr 26, 1966           MRN: [id number] Visit Date: 12/29/2023 Requested by: Otho Darnelle BRAVO, MD [address] [place],  [place] [address] PCP: Otho Darnelle BRAVO, MD  Subjective: Chief Complaint  Patient presents with   Left Foot - Pain   Left Leg - Pain    HPI: Beverly Salazar is a 58 y.o. female who presents to the office reporting left leg pain.  Patient states that she has had months of leg pain localizing to the posterior lateral calf extending to the lateral ankle/foot into the bottom of her foot.  This has been ongoing for several months but especially in the last few weeks it has become more more constant.  It wakes her up from sleep pretty much every night.  She will have very occasional and rare thigh pain as well has pain at the top of her buttock.  She has sporadic low back pain but the main concern is her lower leg pain.  She also has intermittent right sided hip/buttock pain that she has discussed with her PCP and has not found a solution for yet.  She describes the symptoms as a pressure.  No numbness or tingling that she has noticed.  Takes gabapentin  300 mg 3 times a day which was helping initially but now not so much.  She walks with a cane outside of her house but now she is also using it in the house due to the severity of the pain causing her leg to give out whenever it hits..                ROS: All systems reviewed are negative as they relate to the chief complaint within the history of present illness.  Patient denies fevers or chills.  Assessment & Plan: Visit Diagnoses:  1. Chronic midline low back pain with left-sided sciatica     Plan: Patient is a 58 year old female who presents for evaluation of primarily left leg pain.  She has what sounds like radicular pain down the majority of her lower left leg.  She also has 4/5 strength of her quadricep and hamstring of the left leg  based on exam today and positive straight leg raise.  She has prior MRI of the lumbar spine from about 3.5 years ago demonstrating moderate right foraminal stenosis at L2-L3 which may be responsible for her right hip symptoms as well as some mild foraminal narrowing at other levels which may be progressed at this point based on her symptom level and the weakness she demonstrates.  Plan for further evaluation with new MRI of the lumbar spine and follow-up to review results.  Follow-Up Instructions: No follow-ups on file.   Orders:  Orders Placed This Encounter  Procedures   MR Lumbar Spine w/o contrast   No orders of the defined types were placed in this encounter.     Procedures: No procedures performed   Clinical Data: No additional findings.  Objective: Vital Signs: LMP  (LMP Unknown)   Physical Exam:  Constitutional: Patient appears well-developed HEENT:  Head: Normocephalic Eyes:EOM are normal Neck: Normal range of motion Cardiovascular: Normal rate Pulmonary/chest: Effort normal Neurologic: Patient is alert Skin: Skin is warm Psychiatric: Patient has normal mood and affect  Ortho Exam: Ortho exam demonstrates negative straight leg raise on right but positive straight leg  raise on left.  Intact ankle dorsiflexion and plantarflexion strength as well as EHL rated 5/5 bilaterally.  She does have hamstring and quadricep weakness of the left leg rated 4/5 relative to 5/5 in the right leg.  Bilateral hip flexion strength rated 5/5.  No clonus noted bilaterally.  She has no pain with passive hip range of motion of the left hip.  Negative Stinchfield sign.  Specialty Comments:  MRI LUMBAR SPINE WITHOUT CONTRAST   TECHNIQUE: Multiplanar, multisequence MR imaging of the lumbar spine was performed. No intravenous contrast was administered.   COMPARISON:  05/26/2018   FINDINGS: Segmentation:  5 lumbar type vertebrae   Alignment:  Physiologic.   Vertebrae: Marrow edema  associated with the right L2-3 facet. No fracture, discitis, or aggressive bone lesion   Conus medullaris and cauda equina: Conus extends to the L1 level. Conus and cauda equina appear normal.   Paraspinal and other soft tissues: A large gallstone is noted.   Disc levels:   T12- L1: Unremarkable.   L1-L2: Unremarkable.   L2-L3: Disc narrowing and desiccation with circumferential bulging. Facet osteoarthritis with spurring and left-sided joint effusion. Moderate right and mild left foraminal narrowing   L3-L4: Degenerative facet spurring. Mild disc narrowing with circumferential bulge. No neural compression   L4-L5: Disc narrowing and bulging. Mild to moderate degenerative facet spurring with ligamentum flavum thickening. Mild triangular narrowing of the thecal sac.   L5-S1:Mild facet spurring. Mild disc narrowing. No neural compression.   IMPRESSION: 1. Disc bulging and facet osteoarthritis at L2-3 and below. Active facet arthritis with marrow edema on the right at L2-3. 2. L2-3 moderate right foraminal narrowing. No high-grade or compressive left-sided narrowing. 3. Early spinal stenosis at L4-5. 4. Cholelithiasis.     Electronically Signed   By: Cassondra Roulette M.D.   On: 07/30/2020 06:03  Imaging: No results found.   PMFS History: Patient Active Problem List   Diagnosis Date Noted   S/P total knee arthroplasty, left 01/25/2020   Arthritis of knee 12/22/2018   Unilateral primary osteoarthritis, right knee 02/05/2017   Past Medical History:  Diagnosis Date   Anemia    history of   Anxiety    Arthritis    Depression    DVT (deep venous thrombosis) (HCC)    Right calf, has had DVT's twice   Dyspnea    upon exertion   GERD (gastroesophageal reflux disease)    Hypertension    Pre-diabetes     No family history on file.  Past Surgical History:  Procedure Laterality Date   ABDOMINAL HYSTERECTOMY     KNEE ARTHROSCOPY     right 2012, left 2018   TOTAL  KNEE ARTHROPLASTY Right 12/22/2018   Procedure: RIGHT TOTAL KNEE ARTHROPLASTY;  Surgeon: Addie Cordella Hamilton, MD;  Location: [facility] OR;  Service: Orthopedics;  Laterality: Right;   TOTAL KNEE ARTHROPLASTY Left 01/25/2020   Procedure: LEFT TOTAL KNEE ARTHROPLASTY;  Surgeon: Addie Cordella Hamilton, MD;  Location: [facility] ORS;  Service: Orthopedics;  Laterality: Left;   WISDOM TOOTH EXTRACTION     Social History   Occupational History   Not on file  Tobacco Use   Smoking status: Never   Smokeless tobacco: Never  Vaping Use   Vaping status: Never Used  Substance and Sexual Activity   Alcohol  use: No   Drug use: No   Sexual activity: Yes    Birth control/protection: Surgical

## 2024-01-09 ENCOUNTER — Ambulatory Visit: Admitting: Surgical

## 2024-03-15 ENCOUNTER — Other Ambulatory Visit: Payer: Self-pay

## 2024-03-15 ENCOUNTER — Ambulatory Visit: Admitting: Surgical

## 2024-03-15 ENCOUNTER — Encounter: Payer: Self-pay | Admitting: Surgical

## 2024-03-15 DIAGNOSIS — M25551 Pain in right hip: Secondary | ICD-10-CM

## 2024-03-15 DIAGNOSIS — M5410 Radiculopathy, site unspecified: Secondary | ICD-10-CM | POA: Diagnosis not present

## 2024-03-15 DIAGNOSIS — M1611 Unilateral primary osteoarthritis, right hip: Secondary | ICD-10-CM | POA: Diagnosis not present

## 2024-03-15 MED ORDER — GABAPENTIN 300 MG PO CAPS: 300.0000 mg | ORAL_CAPSULE | Freq: Three times a day (TID) | ORAL | 2 refills | Status: AC

## 2024-03-15 NOTE — Progress Notes (Signed)
 Office Visit Note   Patient: Beverly Salazar           Date of Birth: 06/23/1965           MRN: [id number] Visit Date: 03/15/2024 Requested by: Otho Darnelle BRAVO, MD [address] [place],  [place] [address] PCP: Otho Darnelle BRAVO, MD  Subjective: Chief Complaint  Patient presents with   Left Leg - Pain    MRI lumbar review    HPI: Beverly Salazar is a 58 y.o. female who presents to the office reporting left leg pain.  Patient states that she had MRI of her lumbar spine.  Since then she has noticed more left leg pain rather than right leg pain.  She has pain down from the medial thigh that radiates into the posterior calf and into the dorsal aspect of the left foot.  She denies any weakness.  She does have low back pain but her leg pain bothers her more than her back pain.  She also has some right leg pain primarily diffusely around the hip region without much radiation down the leg.  Ambulates with cane.  Takes gabapentin  and Tylenol  with some relief.  Currently taking gabapentin  300 mg 3 times a day.  This leg pain is not all the time and she experiences about 1-2 episodes of leg pain per day.  Does have associated numbness in same distribution as her pain down the left leg..                ROS: All systems reviewed are negative as they relate to the chief complaint within the history of present illness.  Patient denies fevers or chills.  Assessment & Plan: Visit Diagnoses:  1. Radicular syndrome of left leg   2. Pain in right hip   3. Arthritis of right hip     Plan: Impression is 58 year old female with lumbar spine MRI done at [facility].  Unable to see images but by the report, it seems she actually has more right sided stenosis rather than left-sided.  However she does have some left-sided pathology particularly at L4-L5.  We can try and arrange lumbar spine ESI with Dr. Eldonna for left leg radiculopathy and see how much this helps her symptoms.  In the meantime, we can  increase her gabapentin  dosage at night to help her with sleeping.  New prescription sent in.  We can see how this does and see her back afterward to discuss and then consider either diagnostic hip intra-articular injection into the right hip versus right sided lumbar spine ESI to see where the source of her right hip symptoms is.  She does have radiographs of the right hip demonstrating mild arthritis of the right hip with well-preserved joint space but small inferior femoral head osteophyte.    Follow-Up Instructions: No follow-ups on file.   Orders:  Orders Placed This Encounter  Procedures   XR HIP UNILAT W OR W/O PELVIS 2-3 VIEWS RIGHT   Ambulatory referral to Physical Medicine Rehab   Meds ordered this encounter  Medications   gabapentin  (NEURONTIN ) 300 MG capsule    Sig: Take 1 capsule (300 mg total) by mouth 3 (three) times daily. Okay to take 600 mg at night before bed but do not operate motor vehicle or machinery after nighttime dose of    Dispense:  90 capsule    Refill:  2      Procedures: No procedures performed   Clinical Data: No  additional findings.  Objective: Vital Signs: LMP  (LMP Unknown)   Physical Exam:  Constitutional: Patient appears well-developed HEENT:  Head: Normocephalic Eyes:EOM are normal Neck: Normal range of motion Cardiovascular: Normal rate Pulmonary/chest: Effort normal Neurologic: Patient is alert Skin: Skin is warm Psychiatric: Patient has normal mood and affect  Ortho Exam: Ortho exam demonstrates no pain with hip range of motion on the right-hand side.  She has loss of hip internal rotation of the right hip relative to the left by about 5 to 10 degrees.  She has negative FADIR sign.  Hip flexion strength rated 5/5 bilaterally.  Quad strength rated 5/5 bilaterally.  Hamstring strength rated 4/5 on the left and 5/5 on the right with similar strength of dorsiflexion.  Plantarflexion strength rated 5/5 bilaterally.  No clonus noted  bilaterally.  Positive C sign on exam with pain localized to the right hip with this maneuver  Specialty Comments:  MRI LUMBAR SPINE WITHOUT CONTRAST   TECHNIQUE: Multiplanar, multisequence MR imaging of the lumbar spine was performed. No intravenous contrast was administered.   COMPARISON:  05/26/2018   FINDINGS: Segmentation:  5 lumbar type vertebrae   Alignment:  Physiologic.   Vertebrae: Marrow edema associated with the right L2-3 facet. No fracture, discitis, or aggressive bone lesion   Conus medullaris and cauda equina: Conus extends to the L1 level. Conus and cauda equina appear normal.   Paraspinal and other soft tissues: A large gallstone is noted.   Disc levels:   T12- L1: Unremarkable.   L1-L2: Unremarkable.   L2-L3: Disc narrowing and desiccation with circumferential bulging. Facet osteoarthritis with spurring and left-sided joint effusion. Moderate right and mild left foraminal narrowing   L3-L4: Degenerative facet spurring. Mild disc narrowing with circumferential bulge. No neural compression   L4-L5: Disc narrowing and bulging. Mild to moderate degenerative facet spurring with ligamentum flavum thickening. Mild triangular narrowing of the thecal sac.   L5-S1:Mild facet spurring. Mild disc narrowing. No neural compression.   IMPRESSION: 1. Disc bulging and facet osteoarthritis at L2-3 and below. Active facet arthritis with marrow edema on the right at L2-3. 2. L2-3 moderate right foraminal narrowing. No high-grade or compressive left-sided narrowing. 3. Early spinal stenosis at L4-5. 4. Cholelithiasis.     Electronically Signed   By: Cassondra Roulette M.D.   On: 07/30/2020 06:03  Imaging: No results found.   PMFS History: Patient Active Problem List   Diagnosis Date Noted   S/P total knee arthroplasty, left 01/25/2020   Arthritis of knee 12/22/2018   Unilateral primary osteoarthritis, right knee 02/05/2017   Past Medical History:   Diagnosis Date   Anemia    history of   Anxiety    Arthritis    Depression    DVT (deep venous thrombosis) (HCC)    Right calf, has had DVT's twice   Dyspnea    upon exertion   GERD (gastroesophageal reflux disease)    Hypertension    Pre-diabetes     No family history on file.  Past Surgical History:  Procedure Laterality Date   ABDOMINAL HYSTERECTOMY     KNEE ARTHROSCOPY     right 2012, left 2018   TOTAL KNEE ARTHROPLASTY Right 12/22/2018   Procedure: RIGHT TOTAL KNEE ARTHROPLASTY;  Surgeon: Addie Cordella Hamilton, MD;  Location: [facility] OR;  Service: Orthopedics;  Laterality: Right;   TOTAL KNEE ARTHROPLASTY Left 01/25/2020   Procedure: LEFT TOTAL KNEE ARTHROPLASTY;  Surgeon: Addie Cordella Hamilton, MD;  Location: [facility] ORS;  Service: Orthopedics;  Laterality: Left;   WISDOM TOOTH EXTRACTION     Social History   Occupational History   Not on file  Tobacco Use   Smoking status: Never   Smokeless tobacco: Never  Vaping Use   Vaping status: Never Used  Substance and Sexual Activity   Alcohol  use: No   Drug use: No   Sexual activity: Yes    Birth control/protection: Surgical

## 2024-04-12 ENCOUNTER — Encounter: Payer: Self-pay | Admitting: Radiology

## 2024-04-14 ENCOUNTER — Ambulatory Visit: Admitting: Physical Medicine and Rehabilitation

## 2024-04-14 ENCOUNTER — Other Ambulatory Visit: Payer: Self-pay

## 2024-04-14 VITALS — BP 138/73 | HR 59

## 2024-04-14 DIAGNOSIS — M5416 Radiculopathy, lumbar region: Secondary | ICD-10-CM | POA: Diagnosis not present

## 2024-04-14 MED ORDER — METHYLPREDNISOLONE ACETATE 40 MG/ML IJ SUSP
40.0000 mg | Freq: Once | INTRAMUSCULAR | Status: AC
  Administered 2024-04-14: 40 mg

## 2024-04-14 NOTE — Progress Notes (Signed)
 Pain Scale   Average Pain 5 Patient advising she has chronic lower back pain that radiates to left leg and occ to right leg, pain increases when lying down and decrease when taking mediations.         +Driver, -BT, -Dye Allergies.

## 2024-04-25 NOTE — Progress Notes (Signed)
 Beverly Salazar - 58 y.o. female MRN [id number]  Date of birth: 1966-04-26  Office Visit Note: Visit Date: 04/14/2024 PCP: Otho Darnelle BRAVO, MD Referred by: Otho Darnelle BRAVO, MD  Subjective: Chief Complaint  Patient presents with   Lower Back - Pain   HPI:  Beverly Salazar is a 58 y.o. female who comes in today at the request of Herlene Calix, PA-C for planned Left L5-S1 Lumbar Interlaminar epidural steroid injection with fluoroscopic guidance.  The patient has failed conservative care including home exercise, medications, time and activity modification.  This injection will be diagnostic and hopefully therapeutic.  Please see requesting physician notes for further details and justification.   ROS Otherwise per HPI.  Assessment & Plan: Visit Diagnoses:    ICD-10-CM   1. Lumbar radiculopathy  M54.16 XR C-ARM NO REPORT    Epidural Steroid injection    methylPREDNISolone  acetate (DEPO-MEDROL ) injection 40 mg      Plan: No additional findings.   Meds & Orders:  Meds ordered this encounter  Medications   methylPREDNISolone  acetate (DEPO-MEDROL ) injection 40 mg    Orders Placed This Encounter  Procedures   XR C-ARM NO REPORT   Epidural Steroid injection    Follow-up: Return for visit to requesting provider as needed.   Procedures: No procedures performed  Lumbar Epidural Steroid Injection - Interlaminar Approach with Fluoroscopic Guidance  Patient: Beverly Salazar      Date of Birth: May 19, 1966 MRN: [id number] PCP: Otho Darnelle BRAVO, MD      Visit Date: 04/14/2024   Universal Protocol:     Consent Given By: the patient  Position: PRONE  Additional Comments: Vital signs were monitored before and after the procedure. Patient was prepped and draped in the usual sterile fashion. The correct patient, procedure, and site was verified.   Injection Procedure Details:   Procedure diagnoses: Lumbar radiculopathy [M54.16]   Meds Administered:  Meds ordered this  encounter  Medications   methylPREDNISolone  acetate (DEPO-MEDROL ) injection 40 mg     Laterality: Left  Location/Site:  L5-S1  Needle: 3.5 in., 20 ga. Tuohy  Needle Placement: Paramedian epidural  Findings:   -Comments: Excellent flow of contrast into the epidural space.  Procedure Details: Using a paramedian approach from the side mentioned above, the region overlying the inferior lamina was localized under fluoroscopic visualization and the soft tissues overlying this structure were infiltrated with 4 ml. of 1% Lidocaine  without Epinephrine . The Tuohy needle was inserted into the epidural space using a paramedian approach.   The epidural space was localized using loss of resistance along with counter oblique bi-planar fluoroscopic views.  After negative aspirate for air, blood, and CSF, a 2 ml. volume of Isovue-250 was injected into the epidural space and the flow of contrast was observed. Radiographs were obtained for documentation purposes.    The injectate was administered into the level noted above.   Additional Comments:  The patient tolerated the procedure well Dressing: 2 x 2 sterile gauze and Band-Aid    Post-procedure details: Patient was observed during the procedure. Post-procedure instructions were reviewed.  Patient left the clinic in stable condition.   Clinical History: MR LUMBAR SPINE WITHOUT IV CONTRAST  COMPARISON: 07/29/2020  CLINICAL HISTORY: Low back pain and bilateral leg pain, left greater than right.  TECHNIQUE: SAG T2, SAG T1, SAG STIR, AX T2, AX T1 without IV contrast.  FINDINGS: There is mild leftward curvature of the lumbar spine. Multilevel facet arthrosis is present. There is facet edema  to the right of midline at the L3-4 and L4-5 level involving the pedicles. There is mild edema in the L4 spinous process which is likely reactive. Facet arthrosis has progressed and facet edema is new when compared with the prior examination. Mild  grade 1 anterior spondylolisthesis is present L4-5 secondary to facet arthrosis. There is no vertebral body height loss, subluxation or marrow replacing process. The sacrum and SI joints are unremarkable so far as visualized. Conus and cauda equina are unremarkable.  T12-L1: There is no focal disc protrusion, foraminal or spinal stenosis. Mild facet arthrosis.  L1-2: There is no focal disc protrusion, foraminal or spinal stenosis. Mild facet arthrosis.  L2-3: Mild broad-based bulge with small left foraminal protrusion. Mild-to-moderate facet arthrosis. Correlation for mild left L2 radiculopathy. No spinal canal narrowing.  L3-4: Mild broad-based disc osteophyte mild-to-moderate facet arthrosis. Mild facet edema is identified on the right with extension into the right L4 pedicle. No significant foraminal or spinal stenosis.  L4-5: Slightly progressed mild grade 1 anterior spondylolisthesis secondary to moderate facet arthrosis. Mild facet edema is present on the right at L4-5 with mild extension into the right L5 pedicle. There is effacement of the ventral thecal sac with crowding of the descending nerve roots in the lateral recess. Slightly progressed caudal foraminal narrowing is present bilaterally, right greater than left. Correlation for L4 radiculopathy.  L5-S1: Broad-based disc osteophyte with small annular tear. No significant foraminal or spinal stenosis.  The retroperitoneal structures demonstrate no significant abnormality.  IMPRESSION: Mild scoliotic curvature and grade 1 anterior spondylolisthesis of L4 on 5. There is progressive facet edema and arthrosis most notably on the right at L3-4 and L4-5. Slight progression of foraminal narrowing at L4-5 secondary to grade 1 anterior spondylolisthesis broad-based bulge. Correlation for mild L4 radiculopathy, right greater than left. No significant spinal stenosis.  Electronically signed by: Norleen Satchel MD 11/20/2023 02:26  PM EDT RP Workstation: [id number] Exam End: 10/30/23     Objective:  VS:  HT:    WT:   BMI:     BP:138/73  HR:(!) 59bpm  TEMP: ( )  RESP:  Physical Exam Vitals and nursing note reviewed.  Constitutional:      General: She is not in acute distress.    Appearance: Normal appearance. She is not ill-appearing.  HENT:     Head: Normocephalic and atraumatic.     Right Ear: External ear normal.     Left Ear: External ear normal.  Eyes:     Extraocular Movements: Extraocular movements intact.  Cardiovascular:     Rate and Rhythm: Normal rate.     Pulses: Normal pulses.  Pulmonary:     Effort: Pulmonary effort is normal. No respiratory distress.  Abdominal:     General: There is no distension.     Palpations: Abdomen is soft.  Musculoskeletal:        General: Tenderness present.     Cervical back: Neck supple.     Right lower leg: No edema.     Left lower leg: No edema.     Comments: Patient has good distal strength with no pain over the greater trochanters.  No clonus or focal weakness.  Skin:    Findings: No erythema, lesion or rash.  Neurological:     General: No focal deficit present.     Mental Status: She is alert and oriented to person, place, and time.     Sensory: No sensory deficit.     Motor: No weakness or  abnormal muscle tone.     Coordination: Coordination normal.  Psychiatric:        Mood and Affect: Mood normal.        Behavior: Behavior normal.      Imaging: No results found.

## 2024-04-25 NOTE — Procedures (Signed)
 Lumbar Epidural Steroid Injection - Interlaminar Approach with Fluoroscopic Guidance  Patient: Beverly Salazar      Date of Birth: Apr 24, 1966 MRN: [id number] PCP: Otho Darnelle BRAVO, MD      Visit Date: 04/14/2024   Universal Protocol:     Consent Given By: the patient  Position: PRONE  Additional Comments: Vital signs were monitored before and after the procedure. Patient was prepped and draped in the usual sterile fashion. The correct patient, procedure, and site was verified.   Injection Procedure Details:   Procedure diagnoses: Lumbar radiculopathy [M54.16]   Meds Administered:  Meds ordered this encounter  Medications   methylPREDNISolone  acetate (DEPO-MEDROL ) injection 40 mg     Laterality: Left  Location/Site:  L5-S1  Needle: 3.5 in., 20 ga. Tuohy  Needle Placement: Paramedian epidural  Findings:   -Comments: Excellent flow of contrast into the epidural space.  Procedure Details: Using a paramedian approach from the side mentioned above, the region overlying the inferior lamina was localized under fluoroscopic visualization and the soft tissues overlying this structure were infiltrated with 4 ml. of 1% Lidocaine  without Epinephrine . The Tuohy needle was inserted into the epidural space using a paramedian approach.   The epidural space was localized using loss of resistance along with counter oblique bi-planar fluoroscopic views.  After negative aspirate for air, blood, and CSF, a 2 ml. volume of Isovue-250 was injected into the epidural space and the flow of contrast was observed. Radiographs were obtained for documentation purposes.    The injectate was administered into the level noted above.   Additional Comments:  The patient tolerated the procedure well Dressing: 2 x 2 sterile gauze and Band-Aid    Post-procedure details: Patient was observed during the procedure. Post-procedure instructions were reviewed.  Patient left the clinic in stable  condition.

## 2024-04-26 ENCOUNTER — Encounter: Payer: Self-pay | Admitting: Physical Medicine and Rehabilitation

## 2024-05-14 ENCOUNTER — Encounter: Payer: Self-pay | Admitting: Physical Medicine and Rehabilitation

## 2024-05-14 ENCOUNTER — Ambulatory Visit: Admitting: Physical Medicine and Rehabilitation

## 2024-05-14 DIAGNOSIS — M47819 Spondylosis without myelopathy or radiculopathy, site unspecified: Secondary | ICD-10-CM

## 2024-05-14 DIAGNOSIS — M47816 Spondylosis without myelopathy or radiculopathy, lumbar region: Secondary | ICD-10-CM

## 2024-05-14 DIAGNOSIS — G8929 Other chronic pain: Secondary | ICD-10-CM

## 2024-05-14 NOTE — Progress Notes (Unsigned)
 Pain Scale   Average Pain 5 Patient advising her injection didn't help much with her pain and now she had pain radiating to left leg at night        +Driver, -BT, -Dye Allergies.

## 2024-05-14 NOTE — Progress Notes (Unsigned)
 Beverly Salazar - 58 y.o. female MRN [id number]  Date of birth: 1966-05-07  Office Visit Note: Visit Date: 05/14/2024 PCP: Otho Darnelle BRAVO, MD Referred by: Otho Darnelle BRAVO, MD  Subjective: Chief Complaint  Patient presents with   Lower Back - Pain   HPI: Beverly Salazar is a 58 y.o. female who comes in today for evaluation of chronic, worsening and severe right sided lower back pain. Also reports intermittent pain radiating to left medial aspect of thigh and entire left lower leg distal to the knee. Lower back pain seems to be biggest pain generator. Pain ongoing for several years. Her pain worsens with laying flat to sleep. She describes pain as sore and tight sensation, currently rates as 5 out of 10. Some relief of pain with home exercise regimen, rest and use of medications. No history of formal physical therapy. Recent lumbar MRI imaging shows progressive facet edema and arthrosis most notably on the right at L3-L4 and L4-L5. Slight progression of foraminal narrowing at L4-L5 secondary to grade 1 anterior spondylolisthesis broad-based bulge. Correlation for mild L4 radiculopathy, right greater than left. She underwent left L5-S1 interlaminar epidural steroid injection in our office on 04/14/2024. She reports no relief of pain with this procedure. Patient denies focal weakness, numbness and tingling. No recent trauma or falls. She is currently using cane to assist with ambulation.   History of LLE nerve study with Dr. Eldonna on 12/19/2021 was normal.       Review of Systems  Musculoskeletal:  Positive for back pain.  Neurological:  Negative for tingling, sensory change, focal weakness and weakness.  All other systems reviewed and are negative.  Otherwise per HPI.  Assessment & Plan: Visit Diagnoses:    ICD-10-CM   1. Chronic right-sided low back pain without sciatica  M54.50 Ambulatory referral to Physical Medicine Rehab   G89.29     2. Spondylosis without myelopathy or  radiculopathy  M47.819 Ambulatory referral to Physical Medicine Rehab    3. Facet arthropathy, lumbar  M47.816 Ambulatory referral to Physical Medicine Rehab       Plan: Findings:  Chronic, worsening and severe right sided lower back pain. Also reports intermittent pain radiating to left medial aspect of thigh and entire left lower leg distal to the knee. Right lower back is biggest cause of pain at this time. She continues to have pain despite good conservative therapies such as home exercise regimen, rest and use of medications. Patients clinical presentation and exam are consistent with facet mediated pain. Prior lumbar MRI imaging shows facet arthropathy on the right at L3-L4 and L4-L5. We discussed treatment plan in detail today. Next step is to perform diagnostic and hopefully therapeutic right L3-L4 and L4-L5 facet joint injections under fluoroscopic guidance. If good relief of pain we can repeat this procedure infrequently, could also look at possibility of longer sustained pain relief with radiofrequency ablation. Patient has no questions at this time. There is 4 out of 5 weakness with left hip flexion, ankle dorsiflexion and EHL.     Meds & Orders: No orders of the defined types were placed in this encounter.   Orders Placed This Encounter  Procedures   Ambulatory referral to Physical Medicine Rehab    Follow-up: Return for Right L3-L4 and L4-L5 facet joint injections.   Procedures: No procedures performed      Clinical History: MR LUMBAR SPINE WITHOUT IV CONTRAST  COMPARISON: 07/29/2020  CLINICAL HISTORY: Low back pain and bilateral leg pain, left  greater than right.  TECHNIQUE: SAG T2, SAG T1, SAG STIR, AX T2, AX T1 without IV contrast.  FINDINGS: There is mild leftward curvature of the lumbar spine. Multilevel facet arthrosis is present. There is facet edema to the right of midline at the L3-4 and L4-5 level involving the pedicles. There is mild edema in the L4  spinous process which is likely reactive. Facet arthrosis has progressed and facet edema is new when compared with the prior examination. Mild grade 1 anterior spondylolisthesis is present L4-5 secondary to facet arthrosis. There is no vertebral body height loss, subluxation or marrow replacing process. The sacrum and SI joints are unremarkable so far as visualized. Conus and cauda equina are unremarkable.  T12-L1: There is no focal disc protrusion, foraminal or spinal stenosis. Mild facet arthrosis.  L1-2: There is no focal disc protrusion, foraminal or spinal stenosis. Mild facet arthrosis.  L2-3: Mild broad-based bulge with small left foraminal protrusion. Mild-to-moderate facet arthrosis. Correlation for mild left L2 radiculopathy. No spinal canal narrowing.  L3-4: Mild broad-based disc osteophyte mild-to-moderate facet arthrosis. Mild facet edema is identified on the right with extension into the right L4 pedicle. No significant foraminal or spinal stenosis.  L4-5: Slightly progressed mild grade 1 anterior spondylolisthesis secondary to moderate facet arthrosis. Mild facet edema is present on the right at L4-5 with mild extension into the right L5 pedicle. There is effacement of the ventral thecal sac with crowding of the descending nerve roots in the lateral recess. Slightly progressed caudal foraminal narrowing is present bilaterally, right greater than left. Correlation for L4 radiculopathy.  L5-S1: Broad-based disc osteophyte with small annular tear. No significant foraminal or spinal stenosis.  The retroperitoneal structures demonstrate no significant abnormality.  IMPRESSION: Mild scoliotic curvature and grade 1 anterior spondylolisthesis of L4 on 5. There is progressive facet edema and arthrosis most notably on the right at L3-4 and L4-5. Slight progression of foraminal narrowing at L4-5 secondary to grade 1 anterior spondylolisthesis broad-based bulge. Correlation  for mild L4 radiculopathy, right greater than left. No significant spinal stenosis.  Electronically signed by: Norleen Satchel MD 11/20/2023 02:26 PM EDT RP Workstation: [id number] Exam End: 10/30/23   She reports that she has never smoked. She has never used smokeless tobacco. No results for input(s): HGBA1C, LABURIC in the last 8760 hours.  Objective:  VS:  HT:    WT:   BMI:     BP:   HR: bpm  TEMP: ( )  RESP:  Physical Exam Vitals and nursing note reviewed.  HENT:     Head: Normocephalic and atraumatic.     Right Ear: External ear normal.     Left Ear: External ear normal.     Nose: Nose normal.     Mouth/Throat:     Mouth: Mucous membranes are moist.  Eyes:     Extraocular Movements: Extraocular movements intact.  Cardiovascular:     Rate and Rhythm: Normal rate.     Pulses: Normal pulses.  Pulmonary:     Effort: Pulmonary effort is normal.  Abdominal:     General: Abdomen is flat. There is no distension.  Musculoskeletal:        General: Tenderness present.     Cervical back: Normal range of motion.     Comments: Patient rises from seated position to standing without difficulty. Good lumbar range of motion. No pain noted with facet loading. 5/5 strength noted with right hip flexion, knee flexion/extension, ankle dorsiflexion/plantarflexion and EHL. 4/5 strength noted with  left hip flexion, ankle dorsiflexion and EHL. 5/5 with left knee flexion/extension. No clonus noted bilaterally. No pain upon palpation of greater trochanters. No pain with internal/external rotation of bilateral hips. Sensation intact bilaterally. Negative slump test bilaterally. Ambulates with cane, gait slow and unsteady.     Skin:    General: Skin is warm and dry.     Capillary Refill: Capillary refill takes less than 2 seconds.  Neurological:     Mental Status: She is alert and oriented to person, place, and time.     Motor: Weakness present.     Gait: Gait abnormal.  Psychiatric:         Mood and Affect: Mood normal.        Behavior: Behavior normal.     Ortho Exam  Imaging: No results found.  Past Medical/Family/Surgical/Social History: Medications & Allergies reviewed per EMR, new medications updated. Patient Active Problem List   Diagnosis Date Noted   S/P total knee arthroplasty, left 01/25/2020   Arthritis of knee 12/22/2018   Unilateral primary osteoarthritis, right knee 02/05/2017   Past Medical History:  Diagnosis Date   Anemia    history of   Anxiety    Arthritis    Depression    DVT (deep venous thrombosis) (HCC)    Right calf, has had DVT's twice   Dyspnea    upon exertion   GERD (gastroesophageal reflux disease)    Hypertension    Pre-diabetes    No family history on file. Past Surgical History:  Procedure Laterality Date   ABDOMINAL HYSTERECTOMY     KNEE ARTHROSCOPY     right 2012, left 2018   TOTAL KNEE ARTHROPLASTY Right 12/22/2018   Procedure: RIGHT TOTAL KNEE ARTHROPLASTY;  Surgeon: Addie Cordella Hamilton, MD;  Location[facility];  Service: Orthopedics;  Laterality: Right;   TOTAL KNEE ARTHROPLASTY Left 01/25/2020   Procedure: LEFT TOTAL KNEE ARTHROPLASTY;  Surgeon: Addie Cordella Hamilton, MD;  Location: [facility] ORS;  Service: Orthopedics;  Laterality: Left;   WISDOM TOOTH EXTRACTION     Social History   Occupational History   Not on file  Tobacco Use   Smoking status: Never   Smokeless tobacco: Never  Vaping Use   Vaping status: Never Used  Substance and Sexual Activity   Alcohol  use: No   Drug use: No   Sexual activity: Yes    Birth control/protection: Surgical

## 2024-05-16 ENCOUNTER — Encounter: Payer: Self-pay | Admitting: Physical Medicine and Rehabilitation

## 2024-06-13 ENCOUNTER — Encounter: Payer: Self-pay | Admitting: Physical Medicine and Rehabilitation

## 2024-06-16 ENCOUNTER — Encounter: Admitting: Physical Medicine and Rehabilitation

## 2024-06-21 ENCOUNTER — Ambulatory Visit: Admitting: Surgical

## 2024-06-21 ENCOUNTER — Other Ambulatory Visit (INDEPENDENT_AMBULATORY_CARE_PROVIDER_SITE_OTHER): Payer: Self-pay

## 2024-06-21 DIAGNOSIS — M792 Neuralgia and neuritis, unspecified: Secondary | ICD-10-CM | POA: Diagnosis not present

## 2024-06-21 DIAGNOSIS — M79601 Pain in right arm: Secondary | ICD-10-CM

## 2024-06-21 NOTE — Progress Notes (Signed)
 "  Office Visit Note   Patient: Beverly Salazar           Date of Birth: Nov 06, 1965           MRN: [id number] Visit Date: 06/21/2024 Requested by: Otho Darnelle BRAVO, MD [address] [place],  [place] [address] PCP: Otho Darnelle BRAVO, MD  Subjective: Chief Complaint  Patient presents with   Other    Right arm/neck pain    HPI: Beverly Salazar is a 59 y.o. female who presents to the office reporting neck and right arm pain.  Patient states that she woke up 1 day on 06/01/2024 with a crick in the neck.  This began causing more trapezius and scapular pain and then progressed to radicular arm pain that extends down from the neck through the trapezius region into the shoulder and down to the forearm.  This only happens in the right arm without any left symptoms.  Denies any numbness or tingling.  She describes the pain as a pressure that is achy and feels there is a heaviness to the arm with difficulty lifting.  She denies any pain that wakes her up from sleep at night.  Her symptoms are relatively constant.  It is better if she lays on her right arm and worse if she lays on her left side.  She has tried Tylenol  and Aleve  without much relief.  Tried taking Neurontin  and Robaxin  from her PCP.  Denies any history of diabetes or thyroid disease.  No history of prior neck or shoulder surgery.  She is right-hand dominant..                ROS: All systems reviewed are negative as they relate to the chief complaint within the history of present illness.  Patient denies fevers or chills.  Assessment & Plan: Visit Diagnoses:  1. Radicular pain in right arm   2. Right arm pain     Plan: Patient is a 59 year old female who is here to evaluate source of right arm pain.  She has radicular right arm pain since several days after 06/01/2024.  She woke up that day with a crick in the neck that has progressed.  Now has constant pain and heaviness consistent with cervical radiculopathy.  She  has some loss of tricep strength of the right arm relative to the left.  After review of radiographs and physical exam, plan for MRI of the cervical spine to evaluate right sided radiculopathy.  Follow-up after MRI to review results.  Follow-Up Instructions: No follow-ups on file.   Orders:  Orders Placed This Encounter  Procedures   XR Cervical Spine 2 or 3 views   XR Shoulder Right   No orders of the defined types were placed in this encounter.     Procedures: No procedures performed   Clinical Data: No additional findings.  Objective: Vital Signs: LMP  (LMP Unknown)   Physical Exam:  Constitutional: Patient appears well-developed HEENT:  Head: Normocephalic Eyes:EOM are normal Neck: Normal range of motion Cardiovascular: Normal rate Pulmonary/chest: Effort normal Neurologic: Patient is alert Skin: Skin is warm Psychiatric: Patient has normal mood and affect  Ortho Exam: Ortho exam demonstrates ortho exam demonstrates left shoulder with 70 degrees X rotation, 100 degrees abduction, 150 degrees forward elevation which is compared with the right shoulder with 70 degrees external rotation, 80 degrees abduction, 140 degrees forward elevation passively.  No obvious deformity to inspection of the shoulder.  Axillary nerve is  intact with deltoid firing.  2+ radial pulse of the bilateral upper extremities.  Intact EPL, FPL, finger abduction, pronation/supination, bicep, tricep, deltoid; however there is some asymmetric tricep weakness with right tricep strength rated 4/5 relative to 5/5 of the left tricep.  Rotator cuff strength testing demonstrates intact supra, infra, subscap rated 5/5 bilaterally.  Tender with palpation over trapezius muscle.  Negative crossarm adduction test.  Negative O'Brien sign.  Negative Neer's and Hawkins impingement signs.  No cellulitis or skin changes noted.  No crepitus noted with passive motion of the shoulder.  Negative external rotation lag sign.   Negative Hornblower sign.  Negative Spurling sign.  Positive Lhermitte sign.  No pain reproduced with cervical spine range of motion rotating to the left but rotating to the right does cause some discomfort in the trap region.  Specialty Comments:  MR LUMBAR SPINE WITHOUT IV CONTRAST  COMPARISON: 07/29/2020  CLINICAL HISTORY: Low back pain and bilateral leg pain, left greater than right.  TECHNIQUE: SAG T2, SAG T1, SAG STIR, AX T2, AX T1 without IV contrast.  FINDINGS: There is mild leftward curvature of the lumbar spine. Multilevel facet arthrosis is present. There is facet edema to the right of midline at the L3-4 and L4-5 level involving the pedicles. There is mild edema in the L4 spinous process which is likely reactive. Facet arthrosis has progressed and facet edema is new when compared with the prior examination. Mild grade 1 anterior spondylolisthesis is present L4-5 secondary to facet arthrosis. There is no vertebral body height loss, subluxation or marrow replacing process. The sacrum and SI joints are unremarkable so far as visualized. Conus and cauda equina are unremarkable.  T12-L1: There is no focal disc protrusion, foraminal or spinal stenosis. Mild facet arthrosis.  L1-2: There is no focal disc protrusion, foraminal or spinal stenosis. Mild facet arthrosis.  L2-3: Mild broad-based bulge with small left foraminal protrusion. Mild-to-moderate facet arthrosis. Correlation for mild left L2 radiculopathy. No spinal canal narrowing.  L3-4: Mild broad-based disc osteophyte mild-to-moderate facet arthrosis. Mild facet edema is identified on the right with extension into the right L4 pedicle. No significant foraminal or spinal stenosis.  L4-5: Slightly progressed mild grade 1 anterior spondylolisthesis secondary to moderate facet arthrosis. Mild facet edema is present on the right at L4-5 with mild extension into the right L5 pedicle. There is effacement of the  ventral thecal sac with crowding of the descending nerve roots in the lateral recess. Slightly progressed caudal foraminal narrowing is present bilaterally, right greater than left. Correlation for L4 radiculopathy.  L5-S1: Broad-based disc osteophyte with small annular tear. No significant foraminal or spinal stenosis.  The retroperitoneal structures demonstrate no significant abnormality.  IMPRESSION: Mild scoliotic curvature and grade 1 anterior spondylolisthesis of L4 on 5. There is progressive facet edema and arthrosis most notably on the right at L3-4 and L4-5. Slight progression of foraminal narrowing at L4-5 secondary to grade 1 anterior spondylolisthesis broad-based bulge. Correlation for mild L4 radiculopathy, right greater than left. No significant spinal stenosis.  Electronically signed by: Norleen Satchel MD 11/20/2023 02:26 PM EDT RP Workstation: [id number] Exam End: 10/30/23  Imaging: No results found.   PMFS History: Patient Active Problem List   Diagnosis Date Noted   S/P total knee arthroplasty, left 01/25/2020   Arthritis of knee 12/22/2018   Unilateral primary osteoarthritis, right knee 02/05/2017   Past Medical History:  Diagnosis Date   Anemia    history of   Anxiety    Arthritis  Depression    DVT (deep venous thrombosis) (HCC)    Right calf, has had DVT's twice   Dyspnea    upon exertion   GERD (gastroesophageal reflux disease)    Hypertension    Pre-diabetes     No family history on file.  Past Surgical History:  Procedure Laterality Date   ABDOMINAL HYSTERECTOMY     KNEE ARTHROSCOPY     right 2012, left 2018   TOTAL KNEE ARTHROPLASTY Right 12/22/2018   Procedure: RIGHT TOTAL KNEE ARTHROPLASTY;  Surgeon: Addie Cordella Hamilton, MD;  Location: [facility] OR;  Service: Orthopedics;  Laterality: Right;   TOTAL KNEE ARTHROPLASTY Left 01/25/2020   Procedure: LEFT TOTAL KNEE ARTHROPLASTY;  Surgeon: Addie Cordella Hamilton, MD;  Location: [facility] ORS;  Service:  Orthopedics;  Laterality: Left;   WISDOM TOOTH EXTRACTION     Social History   Occupational History   Not on file  Tobacco Use   Smoking status: Never   Smokeless tobacco: Never  Vaping Use   Vaping status: Never Used  Substance and Sexual Activity   Alcohol  use: No   Drug use: No   Sexual activity: Yes    Birth control/protection: Surgical        "

## 2024-06-25 ENCOUNTER — Ambulatory Visit: Admitting: Surgical

## 2024-06-27 ENCOUNTER — Encounter: Payer: Self-pay | Admitting: Surgical

## 2024-07-02 ENCOUNTER — Ambulatory Visit: Admitting: Surgical

## 2024-07-02 ENCOUNTER — Ambulatory Visit
Admission: RE | Admit: 2024-07-02 | Discharge: 2024-07-02 | Disposition: A | Source: Ambulatory Visit | Attending: Orthopedic Surgery | Admitting: Orthopedic Surgery

## 2024-07-02 DIAGNOSIS — M79601 Pain in right arm: Secondary | ICD-10-CM

## 2024-07-03 ENCOUNTER — Ambulatory Visit: Payer: Self-pay | Admitting: Orthopedic Surgery

## 2024-07-03 NOTE — Progress Notes (Signed)
 Can u call with redults and set up fpr csp esi thx

## 2024-07-06 ENCOUNTER — Other Ambulatory Visit

## 2024-07-07 ENCOUNTER — Other Ambulatory Visit: Payer: Self-pay

## 2024-07-07 DIAGNOSIS — M792 Neuralgia and neuritis, unspecified: Secondary | ICD-10-CM

## 2024-07-07 DIAGNOSIS — M79601 Pain in right arm: Secondary | ICD-10-CM

## 2024-07-07 NOTE — Progress Notes (Signed)
 Hi Beverly Salazar, I called Beverly Salazar and left a voicemail message detailing her MRI results and the next steps for plan for getting her arm feeling better.  Can we send her to see Dr. Eldonna for cervical spine ESI?

## 2024-07-09 ENCOUNTER — Telehealth: Payer: Self-pay

## 2024-07-09 DIAGNOSIS — F411 Generalized anxiety disorder: Secondary | ICD-10-CM

## 2024-07-09 MED ORDER — DIAZEPAM 5 MG PO TABS: ORAL_TABLET | ORAL | 0 refills | Status: AC

## 2024-07-09 NOTE — Telephone Encounter (Signed)
 Pre Procedural Valium 

## 2024-07-09 NOTE — Addendum Note (Signed)
 Addended by: ELDONNA GARDINER POUR on: 07/09/2024 08:16 AM   Modules accepted: Orders

## 2024-07-15 ENCOUNTER — Ambulatory Visit: Admitting: Surgical

## 2024-08-02 ENCOUNTER — Encounter: Admitting: Physical Medicine and Rehabilitation
